# Patient Record
Sex: Female | Born: 1975
Health system: Southern US, Community
[De-identification: ages and names within clinical notes are randomized; demographics above are authoritative.]

## PROBLEM LIST (undated history)

## (undated) DIAGNOSIS — K760 Fatty (change of) liver, not elsewhere classified: Secondary | ICD-10-CM

## (undated) DIAGNOSIS — F329 Major depressive disorder, single episode, unspecified: Secondary | ICD-10-CM

## (undated) DIAGNOSIS — F419 Anxiety disorder, unspecified: Secondary | ICD-10-CM

## (undated) DIAGNOSIS — F32A Depression, unspecified: Secondary | ICD-10-CM

## (undated) DIAGNOSIS — K8020 Calculus of gallbladder without cholecystitis without obstruction: Secondary | ICD-10-CM

## (undated) DIAGNOSIS — K859 Acute pancreatitis without necrosis or infection, unspecified: Secondary | ICD-10-CM

## (undated) DIAGNOSIS — D179 Benign lipomatous neoplasm, unspecified: Secondary | ICD-10-CM

## (undated) DIAGNOSIS — B019 Varicella without complication: Secondary | ICD-10-CM

## (undated) DIAGNOSIS — Z87442 Personal history of urinary calculi: Secondary | ICD-10-CM

## (undated) DIAGNOSIS — M1990 Unspecified osteoarthritis, unspecified site: Secondary | ICD-10-CM

## (undated) DIAGNOSIS — E785 Hyperlipidemia, unspecified: Secondary | ICD-10-CM

## (undated) DIAGNOSIS — K219 Gastro-esophageal reflux disease without esophagitis: Secondary | ICD-10-CM

## (undated) HISTORY — DX: Major depressive disorder, single episode, unspecified: F32.9

## (undated) HISTORY — DX: Varicella without complication: B01.9

## (undated) HISTORY — DX: Depression, unspecified: F32.A

## (undated) HISTORY — PX: UPPER GI ENDOSCOPY: SHX6162

## (undated) HISTORY — DX: Anxiety disorder, unspecified: F41.9

## (undated) HISTORY — PX: CHOLECYSTECTOMY: SHX55

## (undated) HISTORY — PX: SHOULDER SURGERY: SHX246

## (undated) HISTORY — DX: Unspecified osteoarthritis, unspecified site: M19.90

## (undated) HISTORY — DX: Acute pancreatitis without necrosis or infection, unspecified: K85.90

## (undated) HISTORY — DX: Gastro-esophageal reflux disease without esophagitis: K21.9

## (undated) HISTORY — DX: Hyperlipidemia, unspecified: E78.5

## (undated) HISTORY — DX: Calculus of gallbladder without cholecystitis without obstruction: K80.20

---

## 2008-02-16 ENCOUNTER — Other Ambulatory Visit: Admission: RE | Admit: 2008-02-16 | Discharge: 2008-02-16 | Payer: Self-pay | Admitting: Gynecology

## 2008-03-23 ENCOUNTER — Emergency Department (HOSPITAL_BASED_OUTPATIENT_CLINIC_OR_DEPARTMENT_OTHER): Admission: EM | Admit: 2008-03-23 | Discharge: 2008-03-23 | Payer: Self-pay | Admitting: Emergency Medicine

## 2008-07-22 ENCOUNTER — Other Ambulatory Visit: Admission: RE | Admit: 2008-07-22 | Discharge: 2008-07-22 | Payer: Self-pay | Admitting: Gynecology

## 2011-04-02 LAB — COMPREHENSIVE METABOLIC PANEL
BUN: 7
Calcium: 9.2
Glucose, Bld: 103 — ABNORMAL HIGH
Sodium: 140
Total Protein: 7.7

## 2011-04-02 LAB — PREGNANCY, URINE: Preg Test, Ur: NEGATIVE

## 2011-04-02 LAB — CBC
HCT: 42.4
Hemoglobin: 14.5
MCHC: 34.2
RDW: 12.5

## 2011-04-02 LAB — LIPASE, BLOOD: Lipase: 90

## 2013-04-28 ENCOUNTER — Ambulatory Visit (INDEPENDENT_AMBULATORY_CARE_PROVIDER_SITE_OTHER): Payer: BC Managed Care – PPO | Admitting: Physician Assistant

## 2013-04-28 VITALS — BP 102/78 | HR 80 | Temp 98.3°F | Resp 18 | Ht 65.75 in | Wt 147.4 lb

## 2013-04-28 DIAGNOSIS — R2231 Localized swelling, mass and lump, right upper limb: Secondary | ICD-10-CM

## 2013-04-28 DIAGNOSIS — L738 Other specified follicular disorders: Secondary | ICD-10-CM

## 2013-04-28 DIAGNOSIS — L739 Follicular disorder, unspecified: Secondary | ICD-10-CM

## 2013-04-28 DIAGNOSIS — R229 Localized swelling, mass and lump, unspecified: Secondary | ICD-10-CM

## 2013-04-28 DIAGNOSIS — Z1231 Encounter for screening mammogram for malignant neoplasm of breast: Secondary | ICD-10-CM

## 2013-04-28 LAB — POCT CBC
HCT, POC: 44.7 % (ref 37.7–47.9)
Lymph, poc: 2 (ref 0.6–3.4)
MCHC: 31.1 g/dL — AB (ref 31.8–35.4)
MCV: 100.3 fL — AB (ref 80–97)
POC LYMPH PERCENT: 21.7 %L (ref 10–50)
RDW, POC: 13.8 %
WBC: 9.3 10*3/uL (ref 4.6–10.2)

## 2013-04-28 MED ORDER — CEPHALEXIN 500 MG PO CAPS: 500.0000 mg | ORAL_CAPSULE | Freq: Three times a day (TID) | ORAL | Status: DC

## 2013-04-28 NOTE — Progress Notes (Signed)
[address] [place] [address]   Phone [phone]  Subjective:    Patient ID: Angela Hess, female    DOB: June 22, 1976, 37 y.o.   MRN: [id number]  HPI Pt presents to clinic with area under her left axilla that is tender that she has had for about 4-6 weeks.  She had a few weeks but then it became tender and she was put on Doxy but felt like it did not help much - while she was on doxy she developed a bump under her L axilla and then another one under her L side.  She is concerned because she just finished the abx and now the area under her R axilla is tender.  She does not do SBE and has never had a mammogram.  She has breast-feed 4 children.  She has no family history of breast cancer.  She has done nothing to these areas.  She has continued to shave and use deodorant until today.  Her counselor suggested she be seen for the area.  She feels fine.   Review of Systems  Skin: Positive for wound.       Objective:   Physical Exam  Vitals reviewed. Constitutional: She is oriented to person, place, and time. She appears well-developed and well-nourished.  HENT:  Head: Normocephalic and atraumatic.  Right Ear: External ear normal.  Left Ear: External ear normal.  Neck: Normal range of motion.  Pulmonary/Chest: Effort normal.  Genitourinary: No breast swelling, tenderness, discharge or bleeding.  Lymphadenopathy:       Head (right side): No submental, no submandibular, no tonsillar, no preauricular, no posterior auricular and no occipital adenopathy present.       Head (left side): No submental, no submandibular, no tonsillar, no preauricular, no posterior auricular and no occipital adenopathy present.    She has no cervical adenopathy.    She has no axillary adenopathy.       Right: No supraclavicular adenopathy present.       Left: No supraclavicular adenopathy present.  Neurological: She is alert and oriented to person, place, and time.  Skin: Skin is warm and dry.  Small 1cm  erythematous tender area in right axilla with a central hair follicle but no pustule.  No palpable lesions in the left axilla.  Psychiatric: She has a normal mood and affect. Her behavior is normal. Judgment and thought content normal.   Results for orders placed in visit on 04/28/13  POCT CBC      Result Value Range   WBC 9.3  4.6 - 10.2 K/uL   Lymph, poc 2.0  0.6 - 3.4   POC LYMPH PERCENT 21.7  10 - 50 %L   MID (cbc) 0.5  0 - 0.9   POC MID % 5.8  0 - 12 %M   POC Granulocyte 6.7  2 - 6.9   Granulocyte percent 72.5  37 - 80 %G   RBC 4.46  4.04 - 5.48 M/uL   Hemoglobin 13.9  12.2 - 16.2 g/dL   HCT, POC 46.9  62.9 - 47.9 %   MCV 100.3 (*) 80 - 97 fL   MCH, POC 31.2  27 - 31.2 pg   MCHC 31.1 (*) 31.8 - 35.4 g/dL   RDW, POC 52.8     Platelet Count, POC 284  142 - 424 K/uL   MPV 9.3  0 - 99.8 fL       Assessment & Plan:  Folliculitis - Plan: cephALEXin (KEFLEX) 500  MG capsule, POCT CBC  Visit for screening mammogram - Plan: MM Digital Screening  Axillary mass, right - most likely this is a reaction from shaving and a local reaction or folliculitis that I am unsure why it did not respond to Doxy.  We will start keflex.  She will stop shaving and using deodorant while this heals and use warm compresses.  We will check a mammogram for screening purposes due to the fact that the bump is red and resolves in several days and the fact that is does not feel like a lymph node and moves sides.  Benny Lennert PA-C 04/28/2013 12:16 PM

## 2013-06-06 ENCOUNTER — Ambulatory Visit: Payer: BC Managed Care – PPO

## 2013-06-06 ENCOUNTER — Ambulatory Visit: Payer: Self-pay

## 2013-06-06 ENCOUNTER — Ambulatory Visit (INDEPENDENT_AMBULATORY_CARE_PROVIDER_SITE_OTHER): Payer: BC Managed Care – PPO | Admitting: Family Medicine

## 2013-06-06 VITALS — BP 124/80 | HR 94 | Temp 98.2°F | Resp 16 | Ht 65.5 in | Wt 148.0 lb

## 2013-06-06 DIAGNOSIS — R519 Headache, unspecified: Secondary | ICD-10-CM

## 2013-06-06 DIAGNOSIS — M25512 Pain in left shoulder: Secondary | ICD-10-CM

## 2013-06-06 DIAGNOSIS — M25539 Pain in unspecified wrist: Secondary | ICD-10-CM

## 2013-06-06 DIAGNOSIS — M542 Cervicalgia: Secondary | ICD-10-CM

## 2013-06-06 DIAGNOSIS — M79641 Pain in right hand: Secondary | ICD-10-CM

## 2013-06-06 DIAGNOSIS — M79609 Pain in unspecified limb: Secondary | ICD-10-CM

## 2013-06-06 DIAGNOSIS — R51 Headache: Secondary | ICD-10-CM

## 2013-06-06 DIAGNOSIS — M25531 Pain in right wrist: Secondary | ICD-10-CM

## 2013-06-06 DIAGNOSIS — M25519 Pain in unspecified shoulder: Secondary | ICD-10-CM

## 2013-06-06 MED ORDER — CYCLOBENZAPRINE HCL 10 MG PO TABS: 10.0000 mg | ORAL_TABLET | Freq: Three times a day (TID) | ORAL | Status: DC | PRN

## 2013-06-06 MED ORDER — HYDROCODONE-ACETAMINOPHEN 5-325 MG PO TABS: 1.0000 | ORAL_TABLET | Freq: Four times a day (QID) | ORAL | Status: DC | PRN

## 2013-06-06 MED ORDER — NAPROXEN 500 MG PO TABS: 500.0000 mg | ORAL_TABLET | Freq: Two times a day (BID) | ORAL | Status: DC
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
## 2013-07-07 ENCOUNTER — Telehealth: Payer: Self-pay

## 2013-07-07 NOTE — Telephone Encounter (Signed)
Request given to xray

## 2013-07-07 NOTE — Telephone Encounter (Signed)
PT WOULD LIKE TO COME BY AND PICK UP A COPY OF HER XRAYS, SHE IS HOPING IT WILL SHOW THE BRUISING. NEED ASAP. PLEASE CALL [phone]
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
## 2015-02-02 ENCOUNTER — Encounter (HOSPITAL_BASED_OUTPATIENT_CLINIC_OR_DEPARTMENT_OTHER): Payer: Self-pay | Admitting: Emergency Medicine

## 2015-02-02 ENCOUNTER — Emergency Department (HOSPITAL_BASED_OUTPATIENT_CLINIC_OR_DEPARTMENT_OTHER)
Admission: EM | Admit: 2015-02-02 | Discharge: 2015-02-02 | Disposition: A | Discharge status: Home / Self Care | Payer: 59 | Attending: Emergency Medicine | Admitting: Emergency Medicine

## 2015-02-02 ENCOUNTER — Emergency Department (HOSPITAL_BASED_OUTPATIENT_CLINIC_OR_DEPARTMENT_OTHER): Payer: 59

## 2015-02-02 DIAGNOSIS — R1011 Right upper quadrant pain: Secondary | ICD-10-CM | POA: Diagnosis not present

## 2015-02-02 DIAGNOSIS — R319 Hematuria, unspecified: Secondary | ICD-10-CM | POA: Insufficient documentation

## 2015-02-02 DIAGNOSIS — M1990 Unspecified osteoarthritis, unspecified site: Secondary | ICD-10-CM | POA: Insufficient documentation

## 2015-02-02 DIAGNOSIS — R1013 Epigastric pain: Secondary | ICD-10-CM | POA: Diagnosis present

## 2015-02-02 DIAGNOSIS — R110 Nausea: Secondary | ICD-10-CM | POA: Insufficient documentation

## 2015-02-02 DIAGNOSIS — Z791 Long term (current) use of non-steroidal anti-inflammatories (NSAID): Secondary | ICD-10-CM | POA: Diagnosis not present

## 2015-02-02 DIAGNOSIS — Z8659 Personal history of other mental and behavioral disorders: Secondary | ICD-10-CM | POA: Insufficient documentation

## 2015-02-02 DIAGNOSIS — Z9049 Acquired absence of other specified parts of digestive tract: Secondary | ICD-10-CM | POA: Insufficient documentation

## 2015-02-02 DIAGNOSIS — R945 Abnormal results of liver function studies: Secondary | ICD-10-CM | POA: Insufficient documentation

## 2015-02-02 DIAGNOSIS — Z3202 Encounter for pregnancy test, result negative: Secondary | ICD-10-CM | POA: Insufficient documentation

## 2015-02-02 DIAGNOSIS — R7989 Other specified abnormal findings of blood chemistry: Secondary | ICD-10-CM

## 2015-02-02 LAB — URINE MICROSCOPIC-ADD ON

## 2015-02-02 LAB — COMPREHENSIVE METABOLIC PANEL
ALBUMIN: 4.2 g/dL (ref 3.5–5.0)
ALT: 107 U/L — AB (ref 14–54)
AST: 269 U/L — AB (ref 15–41)
Alkaline Phosphatase: 98 U/L (ref 38–126)
Anion gap: 9 (ref 5–15)
BILIRUBIN TOTAL: 0.8 mg/dL (ref 0.3–1.2)
BUN: 10 mg/dL (ref 6–20)
CO2: 29 mmol/L (ref 22–32)
Calcium: 9.2 mg/dL (ref 8.9–10.3)
Chloride: 101 mmol/L (ref 101–111)
Creatinine, Ser: 0.73 mg/dL (ref 0.44–1.00)
Glucose, Bld: 108 mg/dL — ABNORMAL HIGH (ref 65–99)
POTASSIUM: 4.3 mmol/L (ref 3.5–5.1)
SODIUM: 139 mmol/L (ref 135–145)
Total Protein: 7.2 g/dL (ref 6.5–8.1)

## 2015-02-02 LAB — URINALYSIS, ROUTINE W REFLEX MICROSCOPIC
Bilirubin Urine: NEGATIVE
Glucose, UA: NEGATIVE mg/dL
Ketones, ur: NEGATIVE mg/dL
Nitrite: NEGATIVE
Protein, ur: NEGATIVE mg/dL
Specific Gravity, Urine: 1.013 (ref 1.005–1.030)
Urobilinogen, UA: 0.2 mg/dL (ref 0.0–1.0)
pH: 7.5 (ref 5.0–8.0)

## 2015-02-02 LAB — CBC
HCT: 43.2 % (ref 36.0–46.0)
HEMOGLOBIN: 14.5 g/dL (ref 12.0–15.0)
MCH: 31.5 pg (ref 26.0–34.0)
MCHC: 33.6 g/dL (ref 30.0–36.0)
MCV: 93.9 fL (ref 78.0–100.0)
Platelets: 270 10*3/uL (ref 150–400)
RBC: 4.6 MIL/uL (ref 3.87–5.11)
RDW: 12.7 % (ref 11.5–15.5)
WBC: 9.9 10*3/uL (ref 4.0–10.5)

## 2015-02-02 LAB — PREGNANCY, URINE: Preg Test, Ur: NEGATIVE

## 2015-02-02 LAB — LIPASE, BLOOD: Lipase: 29 U/L (ref 22–51)

## 2015-02-02 MED ORDER — GI COCKTAIL ~~LOC~~
30.0000 mL | Freq: Once | ORAL | Status: AC
Start: 2015-02-02 — End: 2015-02-02
  Administered 2015-02-02: 30 mL via ORAL
  Filled 2015-02-02: qty 30

## 2015-02-02 NOTE — ED Notes (Signed)
Pt returned from US. NAD.

## 2015-02-02 NOTE — Discharge Instructions (Signed)
Follow up with one of the resources attached to establish care with a primary care doctor.  Abdominal Pain Many things can cause abdominal pain. Usually, abdominal pain is not caused by a disease and will improve without treatment. It can often be observed and treated at home. Your health care provider will do a physical exam and possibly order blood tests and X-rays to help determine the seriousness of your pain. However, in many cases, more time must pass before a clear cause of the pain can be found. Before that point, your health care provider may not know if you need more testing or further treatment. HOME CARE INSTRUCTIONS  Monitor your abdominal pain for any changes. The following actions may help to alleviate any discomfort you are experiencing:  Only take over-the-counter or prescription medicines as directed by your health care provider.  Do not take laxatives unless directed to do so by your health care provider.  Try a clear liquid diet (broth, tea, or water) as directed by your health care provider. Slowly move to a bland diet as tolerated. SEEK MEDICAL CARE IF:  You have unexplained abdominal pain.  You have abdominal pain associated with nausea or diarrhea.  You have pain when you urinate or have a bowel movement.  You experience abdominal pain that wakes you in the night.  You have abdominal pain that is worsened or improved by eating food.  You have abdominal pain that is worsened with eating fatty foods.  You have a fever. SEEK IMMEDIATE MEDICAL CARE IF:   Your pain does not go away within 2 hours.  You keep throwing up (vomiting).  Your pain is felt only in portions of the abdomen, such as the right side or the left lower portion of the abdomen.  You pass bloody or black tarry stools. MAKE SURE YOU:  Understand these instructions.   Will watch your condition.   Will get help right away if you are not doing well or get worse.  Document Released:  03/28/2005 Document Revised: 06/23/2013 Document Reviewed: 02/25/2013 [facility] Patient Information 2015 [place], [place]. This information is not intended to replace advice given to you by your health care provider. Make sure you discuss any questions you have with your health care provider. Liver Profile A liver profile is a battery of tests which helps your caregiver evaluate your liver function. The following tests are often included in the liver profile: Alanine aminotransferase (ALT or SGPT) This is an enzyme found primarily in the liver. Abnormalities may represent liver disease. This is found in cells of the liver so when it is elevated, it has been released by damaged cells. Albumin - The serum albumin is one of the major proteins in the blood and a reflection of the general state of nutrition. This is low when the liver is unable to do its job. It is also low when protein is lost in the urine. NORMAL FINDINGS Adult/Elderly  Total protein: 6.4-8.3 g/dL or 64-83 g/L (SI units)  Albumin: 3.5-5 g/dL or 35-50 g/L (SI units)  Globulin: 2.3-3.4 g/dL  Alpha1 globulin: 0.1-0.3 g/dL or 1-3 g/L (SI units)  Alpha2 globulin: 0.6-1 g/dL or 6-10 g/L (SI units)  Beta globulin: 0.7-1.1 g/dL or 7-11 g/L (SI units) Children  Total protein  Premature infant: 4.2-7.6 g/dL  Newborn: 4.6-7.4 g/dL  Infant: 6-6.7 g/dL  Child: 6.2-8 g/dL  Albumin  Premature infant: 3-4.2 g/dL  Newborn: 3.5-5.4 g/dL  Infant: 4.4-5.4 g/dL  Child: 4-5.9 g/dL Albumin/Globulin ratio - Calculated  by dividing the albumin by the globulin. It is a measure of well being.  Alkaline phosphatase - This is an enzyme which is important in diagnosing proper bone and liver functions. NORMAL FINDINGS Age / Normal Value (units/L)  0-5 days / 35-140  Less than 3 yr / 15-60  3-6 yr / 15-50  6-12 yr / 10-50  12-18 yr / 10-40  Adult / 0-35 units/L or 0-0.58 microKat/L (SI Units) (Females tend to have slightly lower  levels than males)  Elderly / Slightly higher than adults Aspartate aminotransferase (AST or SGOT) - an enzyme found in skeletal and heart muscle, liver and other organs. Abnormalities may represent liver disease. This is found in cells of the liver so when it is elevated, it has been released by damaged cells. Bilirubin, Total: A chemical involved with liver functions. High concentrations may result in jaundice. Jaundice is a yellowing of the skin and the whites of the eyes. NORMAL FINDINGS Blood  Adult/elderly/child  Total bilirubin: 0.3-1.0 mg/dL or 5.1-17 micromole/L (SI units)  Indirect bilirubin: 0.2-0.8 mg/dL or 3.4-12.0 micromole/L (SI units)  Direct bilirubin: 0.1-0.3 mg/dL or 1.7-5.1 micromole/L (SI units)  Newborn total bilirubin: 1.0-12.0 mg/dL or 17.1-205 micromole/L (SI units)  Urine0-0.02 mg/dL Ranges for normal findings may vary among different laboratories and hospitals. You should always check with your doctor after having lab work or other tests done to discuss the meaning of your test results and whether your values are considered within normal limits PREPARATION FOR TEST No preparation or fasting is necessary unless you have been informed otherwise. A blood sample is obtained by inserting a needle into a vein in the arm. MEANING OF TEST  Your caregiver will go over the test results with you and discuss the importance and meaning of your results, as well as treatment options and the need for additional tests if necessary. OBTAINING THE TEST RESULTS It is your responsibility to obtain your test results. Ask the lab or department performing the test when and how you will get your results. Document Released: 07/21/2004 Document Revised: 09/10/2011 Document Reviewed: 10/20/2013 [facility] Patient Information 2015 [place], [place]. This information is not intended to replace advice given to you by your health care provider. Make sure you discuss any questions you have with  your health care provider.

## 2015-02-02 NOTE — ED Provider Notes (Signed)
CSN: [id number]     Arrival date & time 02/02/15  1546 History   First MD Initiated Contact with Patient 02/02/15 1556     Chief Complaint  Patient presents with  . Abdominal Pain     (Consider location/radiation/quality/duration/timing/severity/associated sxs/prior Treatment) HPI Comments: 39 year old female presenting with sudden onset epigastric abdominal pain beginning earlier this morning that radiated towards her right upper quadrant and down just above her umbilicus, lasted about 10 minutes, described as tight, squeezing and intense and subsided on its own. The pain has been waxing and waning throughout the day, just not as intense at initial onset. Occasional radiation towards her back. Admits to associated nausea with onset of pain without vomiting. She felt very sweaty. This afternoon, after urinating, she noticed a small amount of pink on the toilet tissue and believes this was in her urine. Denies vaginal bleeding or discharge. Has a Mirena which has been in place for 4 years. History of cholecystectomy, states this feels almost the same as when she had gallstones. Denies eating anything out of her normal and had a "plain Sandwich" today. Denies fever or chills. No diarrhea or constipation. States her stools appear "caramel-colored".  Patient is a 39 y.o. female presenting with abdominal pain. The history is provided by the patient.  Abdominal Pain Associated symptoms: hematuria and nausea     Past Medical History  Diagnosis Date  . Arthritis   . Anxiety    Past Surgical History  Procedure Laterality Date  . Cholecystectomy    . Shoulder surgery     Family History  Problem Relation Age of Onset  . Kidney disease Mother   . Diabetes Maternal Grandfather   . Heart disease Paternal Grandmother   . Stroke Paternal Grandfather    History  Substance Use Topics  . Smoking status: Never Smoker   . Smokeless tobacco: Not on file  . Alcohol Use: No   OB History    No data  available     Review of Systems  Gastrointestinal: Positive for nausea and abdominal pain.  Genitourinary: Positive for hematuria.  All other systems reviewed and are negative.     Allergies  Review of patient's allergies indicates no known allergies.  Home Medications   Prior to Admission medications   Medication Sig Start Date End Date Taking? Authorizing Provider  cyclobenzaprine (FLEXERIL) 10 MG tablet Take 1 tablet (10 mg total) by mouth 3 (three) times daily as needed for muscle spasms. 06/06/13   Shawnee Knapp, MD  HYDROcodone-acetaminophen (NORCO/VICODIN) 5-325 MG per tablet Take 1 tablet by mouth every 6 (six) hours as needed for moderate pain. 06/06/13   Shawnee Knapp, MD  naproxen (NAPROSYN) 500 MG tablet Take 1 tablet (500 mg total) by mouth 2 (two) times daily with a meal. 06/06/13   Shawnee Knapp, MD   BP 116/89 mmHg  Pulse 68  Temp(Src) 98.6 F (37 C) (Oral)  Resp 16  Ht 5\' 6"  (1.676 m)  Wt 150 lb (68.04 kg)  BMI 24.22 kg/m2  SpO2 100% Physical Exam  Constitutional: She is oriented to person, place, and time. She appears well-developed and well-nourished. No distress.  HENT:  Head: Normocephalic and atraumatic.  Mouth/Throat: Oropharynx is clear and moist.  Eyes: Conjunctivae and EOM are normal.  Neck: Normal range of motion. Neck supple.  Cardiovascular: Normal rate, regular rhythm and normal heart sounds.   Pulmonary/Chest: Effort normal and breath sounds normal. No respiratory distress.  Abdominal: Soft. Normal appearance and  bowel sounds are normal. She exhibits no distension. There is tenderness (mild) in the right upper quadrant and epigastric area. There is no rigidity, no rebound, no guarding, no CVA tenderness, no tenderness at McBurney's point and negative Murphy's sign.  No peritoneal signs.  Musculoskeletal: Normal range of motion. She exhibits no edema.  Neurological: She is alert and oriented to person, place, and time. No sensory deficit.  Skin: Skin is  warm and dry.  Psychiatric: She has a normal mood and affect. Her behavior is normal.  Nursing note and vitals reviewed.   ED Course  Procedures (including critical care time) Labs Review Labs Reviewed  URINALYSIS, ROUTINE W REFLEX MICROSCOPIC (NOT AT [facility]) - Abnormal; Notable for the following:    APPearance CLOUDY (*)    Hgb urine dipstick LARGE (*)    Leukocytes, UA SMALL (*)    All other components within normal limits  COMPREHENSIVE METABOLIC PANEL - Abnormal; Notable for the following:    Glucose, Bld 108 (*)    AST 269 (*)    ALT 107 (*)    All other components within normal limits  URINE MICROSCOPIC-ADD ON - Abnormal; Notable for the following:    Bacteria, UA FEW (*)    All other components within normal limits  PREGNANCY, URINE  CBC  LIPASE, BLOOD    Imaging Review US Abdomen Complete  02/02/2015   CLINICAL DATA:  Recently worsening chronic right upper quadrant abdominal pain. Nausea. Initial encounter.  EXAM: ULTRASOUND ABDOMEN COMPLETE  COMPARISON:  None.  FINDINGS: Gallbladder: Status post cholecystectomy.  No retained stones seen.  Common bile duct: Diameter: 0.4 cm, within normal limits in caliber.  Liver: No focal lesion identified. Mildly increased parenchymal echogenicity likely reflects fatty infiltration.  IVC: No abnormality visualized.  Pancreas: Visualized portion unremarkable.  Spleen: Size and appearance within normal limits.  Right Kidney: Length: 10.1 cm. Echogenicity within normal limits. No mass or hydronephrosis visualized.  Left Kidney: Length: 10.6 cm. Echogenicity within normal limits. No mass or hydronephrosis visualized.  Abdominal aorta: No aneurysm visualized.  Other findings: Evaluation is suboptimal due to overlying bowel gas.  IMPRESSION: 1. No acute abnormality seen within the abdomen. Status post cholecystectomy. 2. Mild fatty infiltration within the liver.   Electronically Signed   By: Garald Balding M.D.   On: 02/02/2015 19:00     EKG  Interpretation None      MDM   Final diagnoses:  Epigastric abdominal pain  Elevated LFTs   Non-toxic appearing, NAD. Abdomen is soft with mild tenderness. No peritoneal signs. Blood noted in urine without infection. Given tenderness epigastrium and RUQ, labs obtained to check LFTs/lipase despite hx of cholecystectomy. LFTs elevated, no increase since 2012. [place] without acute finding. Pt's pain began to improve with GI cocktail. Intense pain did not return in ED. It is possible she passed a ureteral stone with the intense pain, waxing and waning, followed by hematuria. Discussed findings with pt, and advised her to establish care with a PCP. I do not feel CT is necessary at this time as her intense pain has not returned and her repeat exam is benign. Doubt appendicitis, TOA, ovarian torsion, diverticulitis, obstruction. Stable for d/c. Resources given for f/u. Return precautions given. Patient states understanding of treatment care plan and is agreeable.  Carman Ching, PA-C 02/02/15 2003  Virgel Manifold, MD 02/02/15 2038

## 2015-02-02 NOTE — ED Notes (Signed)
PA at bedside.

## 2015-02-02 NOTE — ED Notes (Signed)
Patient transported to Ultrasound 

## 2015-02-02 NOTE — ED Notes (Signed)
Pt history of gallbaldder pain, feels the same as last episode. Pain radiates throughout abdomen. Pt states "feels like a gallstone again."

## 2015-02-15 ENCOUNTER — Ambulatory Visit (INDEPENDENT_AMBULATORY_CARE_PROVIDER_SITE_OTHER): Payer: 59 | Admitting: Family

## 2015-02-15 ENCOUNTER — Other Ambulatory Visit (INDEPENDENT_AMBULATORY_CARE_PROVIDER_SITE_OTHER): Payer: 59

## 2015-02-15 ENCOUNTER — Telehealth: Payer: Self-pay | Admitting: Family

## 2015-02-15 ENCOUNTER — Encounter: Payer: Self-pay | Admitting: Family

## 2015-02-15 VITALS — BP 100/72 | HR 97 | Temp 98.1°F | Resp 18 | Ht 65.5 in | Wt 154.1 lb

## 2015-02-15 DIAGNOSIS — R945 Abnormal results of liver function studies: Principal | ICD-10-CM

## 2015-02-15 DIAGNOSIS — R7989 Other specified abnormal findings of blood chemistry: Secondary | ICD-10-CM

## 2015-02-15 DIAGNOSIS — R635 Abnormal weight gain: Secondary | ICD-10-CM

## 2015-02-15 DIAGNOSIS — M26609 Unspecified temporomandibular joint disorder, unspecified side: Secondary | ICD-10-CM | POA: Insufficient documentation

## 2015-02-15 DIAGNOSIS — M2660 Temporomandibular joint disorder, unspecified: Secondary | ICD-10-CM

## 2015-02-15 LAB — HEPATIC FUNCTION PANEL
ALT: 20 U/L (ref 0–35)
AST: 21 U/L (ref 0–37)
Albumin: 4.4 g/dL (ref 3.5–5.2)
Alkaline Phosphatase: 77 U/L (ref 39–117)
BILIRUBIN TOTAL: 0.9 mg/dL (ref 0.2–1.2)
Bilirubin, Direct: 0.1 mg/dL (ref 0.0–0.3)
Total Protein: 7.2 g/dL (ref 6.0–8.3)

## 2015-02-15 LAB — BASIC METABOLIC PANEL
BUN: 7 mg/dL (ref 6–23)
CO2: 30 meq/L (ref 19–32)
Calcium: 9.2 mg/dL (ref 8.4–10.5)
Chloride: 101 mEq/L (ref 96–112)
Creatinine, Ser: 0.76 mg/dL (ref 0.40–1.20)
GFR: 89.39 mL/min (ref >60.00)
Glucose, Bld: 100 mg/dL — ABNORMAL HIGH (ref 70–99)
POTASSIUM: 4.1 meq/L (ref 3.5–5.1)
SODIUM: 137 meq/L (ref 135–145)

## 2015-02-15 LAB — TSH: TSH: 3.18 u[IU]/mL (ref 0.35–4.50)

## 2015-02-15 NOTE — Assessment & Plan Note (Signed)
Elevated liver enzymes in the ED with no apparent causes noted on ultrasound but did have mild infiltration of fatty liver. Obtain hepatic panel to recheck LFTs. Exam benign today. Pending results may refer to GI given continued symptoms.

## 2015-02-15 NOTE — Patient Instructions (Signed)
Thank you for choosing [facility].  Summary/Instructions:  Please follow up with dentistry for your TMJ  Please stop by the lab on the basement level of the building for your blood work. Your results will be released to [place] (or called to you) after review, usually within 72 hours after test completion. If any changes need to be made, you will be notified at that same time.  If your symptoms worsen or fail to improve, please contact our office for further instruction, or in case of emergency go directly to the emergency room at the closest medical facility.   Temporomandibular Problems  Temporomandibular joint (TMJ) dysfunction means there are problems with the joint between your jaw and your skull. This is a joint lined by cartilage like other joints in your body but also has a small disc in the joint which keeps the bones from rubbing on each other. These joints are like other joints and can get inflamed (sore) from arthritis and other problems. When this joint gets sore, it can cause headaches and pain in the jaw and the face. CAUSES  Usually the arthritic types of problems are caused by soreness in the joint. Soreness in the joint can also be caused by overuse. This may come from grinding your teeth. It may also come from mis-alignment in the joint. DIAGNOSIS Diagnosis of this condition can often be made by history and exam. Sometimes your caregiver may need X-rays or an MRI scan to determine the exact cause. It may be necessary to see your dentist to determine if your teeth and jaws are lined up correctly. TREATMENT  Most of the time this problem is not serious; however, sometimes it can persist (become chronic). When this happens medications that will cut down on inflammation (soreness) help. Sometimes a shot of cortisone into the joint will be helpful. If your teeth are not aligned it may help for your dentist to make a splint for your mouth that can help this problem. If no  physical problems can be found, the problem may come from tension. If tension is found to be the cause, biofeedback or relaxation techniques may be helpful. HOME CARE INSTRUCTIONS   Later in the day, applications of ice packs may be helpful. Ice can be used in a plastic bag with a towel around it to prevent frostbite to skin. This may be used about every 2 hours for 20 to 30 minutes, as needed while awake, or as directed by your caregiver.  Only take over-the-counter or prescription medicines for pain, discomfort, or fever as directed by your caregiver.  If physical therapy was prescribed, follow your caregiver's directions.  Wear mouth appliances as directed if they were given. Document Released: 03/13/2001 Document Revised: 09/10/2011 Document Reviewed: 06/20/2008 [facility] Patient Information 2015 [place], [place]. This information is not intended to replace advice given to you by your health care provider. Make sure you discuss any questions you have with your health care provider.

## 2015-02-15 NOTE — Progress Notes (Signed)
Pre visit review using our clinic review tool, if applicable. No additional management support is needed unless otherwise documented below in the visit note. 

## 2015-02-15 NOTE — Progress Notes (Signed)
Subjective:    Patient ID: Angela Hess, female    DOB: 1976/03/21, 39 y.o.   MRN: [id number]  Chief Complaint  Patient presents with  . Establish Care    TMJ is bothering her, weight gain, sweating, feels like she has to pee more, thinks she is going through menopause, liver levels were high at the hospital    Angela Hess is a 39 y.o. female with a PMH of anxiety, arthritis, depression, GERD, hyperlipidemia, and urinary tract infections who presents today for an office visit to establish care.  1.) Elevated LFTs - Seen in the ED for epigastric pain and noted to have elevated LFTs. US of the abdomen at the time revealed no acute abnormalitiy and mild fatty infiltration within the liver. Describes the pain as sharp that radiates around her abdomen that comes in waves. Modifying factors include TUMS and Zantec which did not help. Describes it as the same feelings as gall stones. Not currently experiencing any pain. Fluctuates between constipation and diarrhea. Does express lactose intolerance. Has a history of erosive gastritis.   2.) Weight gain / sweating - Associated symptom weight gain and sweating has been going on for about 1 year. She has been seen by GYN and informed that she may be starting to go through pre-menopause. Skin does break out more. Heat intolerance.   3.) TMJ - Associated symptom of cracking of her jaw occurs when she eats. Has had a locking of her jaw and the occasional pain. Does note she grinds her teeth at night.  No Known Allergies   Outpatient Prescriptions Prior to Visit  Medication Sig Dispense Refill  . cyclobenzaprine (FLEXERIL) 10 MG tablet Take 1 tablet (10 mg total) by mouth 3 (three) times daily as needed for muscle spasms. 30 tablet 0  . HYDROcodone-acetaminophen (NORCO/VICODIN) 5-325 MG per tablet Take 1 tablet by mouth every 6 (six) hours as needed for moderate pain. 30 tablet 0  . naproxen (NAPROSYN) 500 MG tablet Take 1 tablet (500 mg  total) by mouth 2 (two) times daily with a meal. 30 tablet 0   No facility-administered medications prior to visit.     Past Medical History  Diagnosis Date  . Arthritis   . Anxiety   . Chicken pox   . Depression   . GERD (gastroesophageal reflux disease)   . Hyperlipidemia   . UTI (lower urinary tract infection)      Past Surgical History  Procedure Laterality Date  . Cholecystectomy    . Shoulder surgery       Family History  Problem Relation Age of Onset  . Kidney disease Mother     Kidney stones  . Arthritis Mother   . Diabetes Maternal Grandfather   . Arthritis Maternal Grandfather   . Heart disease Paternal Grandmother   . Arthritis Paternal Grandmother   . Hyperlipidemia Paternal Grandmother   . Stroke Paternal Grandfather   . Arthritis Paternal Grandfather   . Alcohol abuse Father   . Arthritis Maternal Grandmother      Social History   Social History  . Marital Status: Single    Spouse Name: N/A  . Number of Children: 4  . Years of Education: 16   Occupational History  . [employer]    Social History Main Topics  . Smoking status: Never Smoker   . Smokeless tobacco: Never Used  . Alcohol Use: 0.6 - 1.8 oz/week    1-3 Glasses of wine per week  .  Drug Use: No  . Sexual Activity: Yes    Birth Control/ Protection: IUD   Other Topics Concern  . Not on file   Social History Narrative   Fun: [employer] out with the children   Feels safe at home and denies abuse      Review of Systems  Constitutional: Negative for fever and chills.  HENT:       Positive for jaw clicking  Gastrointestinal: Negative for nausea, vomiting, abdominal pain, diarrhea, constipation and blood in stool.  Endocrine: Positive for heat intolerance.      Objective:    BP 100/72 mmHg  Pulse 97  Temp(Src) 98.1 F (36.7 C) (Oral)  Resp 18  Ht 5' 5.5" (1.664 m)  Wt 154 lb 1.9 oz (69.908 kg)  BMI 25.25 kg/m2  SpO2 98% Nursing note and vital signs  reviewed.  Physical Exam  Constitutional: She is oriented to person, place, and time. She appears well-developed and well-nourished. No distress.  Cardiovascular: Normal rate, regular rhythm, normal heart sounds and intact distal pulses.   Pulmonary/Chest: Effort normal and breath sounds normal.  Abdominal: Soft. Normal appearance and bowel sounds are normal. She exhibits no mass. There is no hepatomegaly. There is no tenderness. There is no rigidity, no rebound, no guarding, no tenderness at McBurney's point and negative Murphy's sign.  Neurological: She is alert and oriented to person, place, and time.  Skin: Skin is warm and dry.  Psychiatric: She has a normal mood and affect. Her behavior is normal. Judgment and thought content normal.       Assessment & Plan:   Problem List Items Addressed This Visit      Musculoskeletal and Integument   TMJ dysfunction    TMJ noted. Treat conservatively with over the counter medications as needed for discomfort and follow up with dentistry for mouth guard or other interventions.         Other   Elevated liver function tests - Primary    Elevated liver enzymes in the ED with no apparent causes noted on ultrasound but did have mild infiltration of fatty liver. Obtain hepatic panel to recheck LFTs. Exam benign today. Pending results may refer to GI given continued symptoms.       Relevant Orders   Hepatic function panel   Basic Metabolic Panel (BMET)   Weight gain    Notes weight gain and may be possibly going through early menopause and cannot rule out thyroid. Obtain TSH. If TSH is negative most likely related to GYN symptoms.       Relevant Orders   TSH

## 2015-02-15 NOTE — Assessment & Plan Note (Signed)
Notes weight gain and may be possibly going through early menopause and cannot rule out thyroid. Obtain TSH. If TSH is negative most likely related to GYN symptoms.

## 2015-02-15 NOTE — Assessment & Plan Note (Signed)
TMJ noted. Treat conservatively with over the counter medications as needed for discomfort and follow up with dentistry for mouth guard or other interventions.

## 2015-02-15 NOTE — Telephone Encounter (Signed)
Please inform patient that her liver enzymes and the rest of her blood work is normal. Therefore I think it is safe to say we can simply watch and wait regarding her liver. Since she continues to experience abdominal pains I will refer her to gastroenterology. Her thyroid function test was borderline increased but within the normal range. Therefore we can retest during her next visit.

## 2015-02-16 ENCOUNTER — Encounter: Payer: Self-pay | Admitting: Family

## 2015-02-17 NOTE — Telephone Encounter (Signed)
Pt aware of results. She was concerned about how her AST and ALT being so high 2 weeks ago and now its back to normal. She just would like to know what would make those levels so high.

## 2015-02-17 NOTE — Telephone Encounter (Signed)
It is most likely a viral infection, unfortunately I cannot say for sure. We will continue to monitor it at this time and recheck in about 3 months.

## 2015-02-18 NOTE — Telephone Encounter (Signed)
Pt emailed

## 2015-04-11 ENCOUNTER — Ambulatory Visit (INDEPENDENT_AMBULATORY_CARE_PROVIDER_SITE_OTHER): Payer: 59 | Admitting: Gastroenterology

## 2015-04-11 ENCOUNTER — Other Ambulatory Visit (INDEPENDENT_AMBULATORY_CARE_PROVIDER_SITE_OTHER): Payer: 59

## 2015-04-11 ENCOUNTER — Encounter: Payer: Self-pay | Admitting: Gastroenterology

## 2015-04-11 VITALS — BP 102/60 | Ht 65.5 in | Wt 158.4 lb

## 2015-04-11 DIAGNOSIS — K219 Gastro-esophageal reflux disease without esophagitis: Secondary | ICD-10-CM | POA: Diagnosis not present

## 2015-04-11 DIAGNOSIS — R109 Unspecified abdominal pain: Secondary | ICD-10-CM

## 2015-04-11 DIAGNOSIS — R7989 Other specified abnormal findings of blood chemistry: Secondary | ICD-10-CM

## 2015-04-11 DIAGNOSIS — R945 Abnormal results of liver function studies: Principal | ICD-10-CM

## 2015-04-11 LAB — IRON AND TIBC
%SAT: 27 % (ref 11–50)
Iron: 85 ug/dL (ref 40–190)
TIBC: 319 ug/dL (ref 250–450)
UIBC: 234 ug/dL (ref 125–400)

## 2015-04-11 LAB — FERRITIN: Ferritin: 61.6 ng/mL (ref 10.0–291.0)

## 2015-04-11 LAB — CK: Total CK: 40 U/L (ref 7–177)

## 2015-04-11 LAB — IGA: IGA: 210 mg/dL (ref 68–378)

## 2015-04-11 NOTE — Progress Notes (Signed)
HPI : 39 y/o female here in consultation for elevated LFTs by Mauricio Po FNP  She reports she was seen in early August with abdominal pain and had elevated LFT with AST > ALT. She presented with abdominal pain, which reminded her of her prior gallstone pain. She had severe pain in the epigastric area in a band distribution. She had diaphoresis with this. She had severe pain lasting for 10 minutes, and then a lingering pain for a 1-2 days or so, and then it resolved on its own. She was given a GI cocktail which did not do anything. She had no no nausea or vomiting. She reports it reminded her of gallstone pain, which led to have have her GB removed in 2011/2012. She reports a history of pancreatitis due to gallstones? Unclear timing of pancreatitis in relation to cholecystectomy but she reports it was during the same admission. She has not had pancreatitis since her gallbladder was removed. She has not had any recurrence of pain since this past August or so. She also had hematuria during this ER visit and was thought perhaps she passed a kidney stone which may have passed to cause her pain. She denies NSAIDs. She denies herbal supplements. No tobacco. She endorses rare alcohol use, one drink per week. She denies recent work outs or muscle pains/myalgias.   She otherwise has some ongoing gas and bloating, often in relation to things she eats, such as dairy. She has occasional loose stools, occasional constipation. She endorses a history of hemorrhoids which can be irritated sporadically.    She has had a prior history of EGD in 2008 and she was found to have gastritis. She takes zantac PRN when her stomach is upset. She does endorse some heartburn at times as well.    Past Medical History  Diagnosis Date  . Arthritis   . Anxiety   . Chicken pox   . Depression   . GERD (gastroesophageal reflux disease)   . Hyperlipidemia   . UTI (lower urinary tract infection)   . Gallstones   . Pancreatitis      history of x 1     Past Surgical History  Procedure Laterality Date  . Cholecystectomy    . Shoulder surgery     Family History  Problem Relation Age of Onset  . Kidney disease Mother     Kidney stones  . Arthritis Mother   . Diabetes Maternal Grandfather   . Arthritis Maternal Grandfather   . Heart disease Paternal Grandmother   . Arthritis Paternal Grandmother   . Hyperlipidemia Paternal Grandmother   . Stroke Paternal Grandfather   . Arthritis Paternal Grandfather   . Alcohol abuse Father   . Arthritis Maternal Grandmother    Social History  Substance Use Topics  . Smoking status: Never Smoker   . Smokeless tobacco: Never Used  . Alcohol Use: 0.6 - 1.8 oz/week    1-3 Glasses of wine per week   Current Outpatient Prescriptions  Medication Sig Dispense Refill  . ALPRAZolam (XANAX) 0.25 MG tablet Take 0.25 mg by mouth at bedtime as needed for anxiety.    Marland Kitchen amphetamine-dextroamphetamine (ADDERALL XR) 30 MG 24 hr capsule Take 30 mg by mouth daily.    Marland Kitchen FLUoxetine (PROZAC) 10 MG capsule Take 10 mg by mouth daily.     No current facility-administered medications for this visit.   No Known Allergies   Review of Systems: All systems reviewed and negative except where noted in HPI.  Lab Results  Component Value Date   ALT 20 02/15/2015   AST 21 02/15/2015   ALKPHOS 77 02/15/2015   BILITOT 0.9 02/15/2015   Lab Results  Component Value Date   WBC 9.9 02/02/2015   HGB 14.5 02/02/2015   HCT 43.2 02/02/2015   MCV 93.9 02/02/2015   PLT 270 02/02/2015    LFTs 02/02/2015 AST 269 ALT 107 AP 98 T bil 0.8  RUQ [place] 8/3 - fatty liver ,otherwise no ductal dilation  Physical Exam: BP 102/60 mmHg  Ht 5' 5.5" (1.664 m)  Wt 158 lb 6.4 oz (71.85 kg)  BMI 25.95 kg/m2 Constitutional: Pleasant,well-developed, female in no acute distress. HEENT: Normocephalic and atraumatic. Conjunctivae are normal. No scleral icterus. Neck supple.  Cardiovascular: Normal rate,  regular rhythm.  Pulmonary/chest: Effort normal and breath sounds normal. No wheezing, rales or rhonchi. Abdominal: Soft, nondistended, nontender. Bowel sounds active throughout. There are no masses palpable. No hepatomegaly. Extremities: no edema Lymphadenopathy: No cervical adenopathy noted. Neurological: Alert and oriented to person place and time. Skin: Skin is warm and dry. No rashes noted. Psychiatric: Normal mood and affect. Behavior is normal.   ASSESSMENT AND PLAN: 39 y/o female seen in consultation for abdominal pain and transaminitis. She reports a history of gallstones causing these symptoms previously, once associated with pancreatitis (per patient report), eventually led to cholecystectomy a few years ago and she has not had an episode since then until August. History as above, short lived pain associated with elevation in AST > ALT. This pattern was noted during an episode previously a few years ago and no normals in between episodes, so unsure of its chronicitiy. However, repeat LFTs when pain free showed normalization of her transaminitis when last checked. Her history is concerning for possibly a retained / intrahepatic stone that passed or sphincter of odi dysfunction post cholecystectomy which could present like this, although [place] did not show any dilation of the ducts or stones. Given the ratio of AST > ALT, I asked her about alcohol use and she is emphatic she doesn't drink much. She otherwise denies myalgias or recent workouts which could led to skeletal muscle breakdown causing AST > ALT elevation. Given she has had multiple lab draws showing a transaminitis over time will obtain baseline lab work to rule out chronic liver diseases, and repeat LFTs to ensure they have remained normal since when last checked. That being said, her symptoms are more concerning for a passed stone or SOD. Will await labs, and consider MRCP / evaluation of the biliary tree pending her course. If she has a  recurrence of pain in the interim I asked her to call me for reassessment.   Otherwise, she has some ongoing reflux symptoms and some nonspecific bloating. Recommended a trial of low dose omeprazole for reflux and to see if it helps her symptoms.  I will also screen her for celiac disease given her bloating, and if symptoms persist we can consider upper endoscopy.   We will let her know results of lab work with further recommendations, and await course on PPI. She agreed.   Brookhaven Cellar, MD [facility] Pager [phone]

## 2015-04-11 NOTE — Patient Instructions (Signed)
Your physician has requested that you go to the basement for  lab work before leaving today.   

## 2015-04-12 LAB — HEPATITIS C ANTIBODY: HCV AB: NEGATIVE

## 2015-04-12 LAB — ANA: ANA: NEGATIVE

## 2015-04-12 LAB — TISSUE TRANSGLUTAMINASE, IGA: TISSUE TRANSGLUTAMINASE AB, IGA: 1 U/mL (ref <4)

## 2015-04-12 LAB — MITOCHONDRIAL ANTIBODIES: Mitochondrial M2 Ab, IgG: 0.2 (ref <0.91)

## 2015-04-12 LAB — ANTI-SMOOTH MUSCLE ANTIBODY, IGG: SMOOTH MUSCLE AB: 6 U (ref <20)

## 2015-04-12 LAB — HEPATITIS B SURFACE ANTIGEN: Hepatitis B Surface Ag: NEGATIVE

## 2015-04-12 LAB — IGG: IGG (IMMUNOGLOBIN G), SERUM: 827 mg/dL (ref 690–1700)

## 2015-04-13 LAB — ALPHA-1-ANTITRYPSIN: A-1 Antitrypsin, Ser: 147 mg/dL (ref 83–199)

## 2015-04-13 LAB — CERULOPLASMIN: CERULOPLASMIN: 20 mg/dL (ref 18–53)

## 2015-04-15 ENCOUNTER — Other Ambulatory Visit: Payer: Self-pay | Admitting: *Deleted

## 2015-04-15 DIAGNOSIS — R7989 Other specified abnormal findings of blood chemistry: Secondary | ICD-10-CM

## 2015-04-15 DIAGNOSIS — R945 Abnormal results of liver function studies: Principal | ICD-10-CM

## 2015-04-22 ENCOUNTER — Other Ambulatory Visit (INDEPENDENT_AMBULATORY_CARE_PROVIDER_SITE_OTHER): Payer: 59

## 2015-04-22 DIAGNOSIS — R945 Abnormal results of liver function studies: Principal | ICD-10-CM

## 2015-04-22 DIAGNOSIS — R7989 Other specified abnormal findings of blood chemistry: Secondary | ICD-10-CM

## 2015-04-22 LAB — HEPATIC FUNCTION PANEL
ALK PHOS: 72 U/L (ref 39–117)
ALT: 10 U/L (ref 0–35)
AST: 15 U/L (ref 0–37)
Albumin: 4.3 g/dL (ref 3.5–5.2)
BILIRUBIN DIRECT: 0.2 mg/dL (ref 0.0–0.3)
BILIRUBIN TOTAL: 0.9 mg/dL (ref 0.2–1.2)
Total Protein: 7.1 g/dL (ref 6.0–8.3)

## 2015-04-25 ENCOUNTER — Other Ambulatory Visit: Payer: Self-pay | Admitting: Gastroenterology

## 2015-04-25 ENCOUNTER — Ambulatory Visit (HOSPITAL_COMMUNITY)
Admission: RE | Admit: 2015-04-25 | Discharge: 2015-04-25 | Disposition: A | Discharge status: Home / Self Care | Payer: 59 | Source: Ambulatory Visit | Attending: Gastroenterology | Admitting: Gastroenterology

## 2015-04-25 DIAGNOSIS — Z9049 Acquired absence of other specified parts of digestive tract: Secondary | ICD-10-CM | POA: Diagnosis not present

## 2015-04-25 DIAGNOSIS — R945 Abnormal results of liver function studies: Principal | ICD-10-CM

## 2015-04-25 DIAGNOSIS — R7989 Other specified abnormal findings of blood chemistry: Secondary | ICD-10-CM

## 2015-04-25 DIAGNOSIS — R109 Unspecified abdominal pain: Secondary | ICD-10-CM | POA: Diagnosis not present

## 2015-04-25 MED ORDER — GADOBENATE DIMEGLUMINE 529 MG/ML IV SOLN
15.0000 mL | Freq: Once | INTRAVENOUS | Status: AC | PRN
  Administered 2015-04-25: 14 mL via INTRAVENOUS

## 2015-08-18 ENCOUNTER — Ambulatory Visit: Payer: 59 | Admitting: Family

## 2015-10-11 DIAGNOSIS — E6609 Other obesity due to excess calories: Secondary | ICD-10-CM | POA: Diagnosis not present

## 2015-10-11 DIAGNOSIS — Z01419 Encounter for gynecological examination (general) (routine) without abnormal findings: Secondary | ICD-10-CM | POA: Diagnosis not present

## 2015-10-11 DIAGNOSIS — Z6825 Body mass index (BMI) 25.0-25.9, adult: Secondary | ICD-10-CM | POA: Diagnosis not present

## 2015-11-02 DIAGNOSIS — Z30433 Encounter for removal and reinsertion of intrauterine contraceptive device: Secondary | ICD-10-CM | POA: Diagnosis not present

## 2015-11-02 DIAGNOSIS — Z30431 Encounter for routine checking of intrauterine contraceptive device: Secondary | ICD-10-CM | POA: Diagnosis not present

## 2015-11-14 DIAGNOSIS — N390 Urinary tract infection, site not specified: Secondary | ICD-10-CM | POA: Diagnosis not present

## 2015-11-14 DIAGNOSIS — N870 Mild cervical dysplasia: Secondary | ICD-10-CM | POA: Diagnosis not present

## 2015-11-14 DIAGNOSIS — R87610 Atypical squamous cells of undetermined significance on cytologic smear of cervix (ASC-US): Secondary | ICD-10-CM | POA: Diagnosis not present

## 2015-12-27 DIAGNOSIS — R87610 Atypical squamous cells of undetermined significance on cytologic smear of cervix (ASC-US): Secondary | ICD-10-CM | POA: Diagnosis not present

## 2016-01-31 DIAGNOSIS — F900 Attention-deficit hyperactivity disorder, predominantly inattentive type: Secondary | ICD-10-CM | POA: Diagnosis not present

## 2016-07-24 DIAGNOSIS — F900 Attention-deficit hyperactivity disorder, predominantly inattentive type: Secondary | ICD-10-CM | POA: Diagnosis not present

## 2016-07-24 DIAGNOSIS — F411 Generalized anxiety disorder: Secondary | ICD-10-CM | POA: Diagnosis not present

## 2017-01-15 DIAGNOSIS — F4322 Adjustment disorder with anxiety: Secondary | ICD-10-CM | POA: Diagnosis not present

## 2017-01-15 DIAGNOSIS — F900 Attention-deficit hyperactivity disorder, predominantly inattentive type: Secondary | ICD-10-CM | POA: Diagnosis not present

## 2017-01-18 ENCOUNTER — Other Ambulatory Visit (INDEPENDENT_AMBULATORY_CARE_PROVIDER_SITE_OTHER): Payer: BLUE CROSS/BLUE SHIELD

## 2017-01-18 ENCOUNTER — Encounter: Payer: Self-pay | Admitting: Family

## 2017-01-18 ENCOUNTER — Ambulatory Visit (INDEPENDENT_AMBULATORY_CARE_PROVIDER_SITE_OTHER): Payer: BLUE CROSS/BLUE SHIELD | Admitting: Family

## 2017-01-18 VITALS — BP 118/86 | HR 94 | Temp 98.3°F | Resp 16 | Ht 66.0 in | Wt 158.0 lb

## 2017-01-18 DIAGNOSIS — R3915 Urgency of urination: Secondary | ICD-10-CM | POA: Insufficient documentation

## 2017-01-18 DIAGNOSIS — R1030 Lower abdominal pain, unspecified: Secondary | ICD-10-CM | POA: Insufficient documentation

## 2017-01-18 LAB — COMPREHENSIVE METABOLIC PANEL
ALBUMIN: 4.5 g/dL (ref 3.5–5.2)
ALK PHOS: 68 U/L (ref 39–117)
ALT: 11 U/L (ref 0–35)
AST: 14 U/L (ref 0–37)
BUN: 9 mg/dL (ref 6–23)
CALCIUM: 9.5 mg/dL (ref 8.4–10.5)
CHLORIDE: 102 meq/L (ref 96–112)
CO2: 29 mEq/L (ref 19–32)
CREATININE: 0.83 mg/dL (ref 0.40–1.20)
GFR: 79.99 mL/min (ref >60.00)
Glucose, Bld: 106 mg/dL — ABNORMAL HIGH (ref 70–99)
POTASSIUM: 4.1 meq/L (ref 3.5–5.1)
SODIUM: 137 meq/L (ref 135–145)
TOTAL PROTEIN: 7 g/dL (ref 6.0–8.3)
Total Bilirubin: 1.4 mg/dL — ABNORMAL HIGH (ref 0.2–1.2)

## 2017-01-18 LAB — CBC
HEMATOCRIT: 43.6 % (ref 36.0–46.0)
HEMOGLOBIN: 14.2 g/dL (ref 12.0–15.0)
MCHC: 32.6 g/dL (ref 30.0–36.0)
MCV: 93.9 fl (ref 78.0–100.0)
Platelets: 276 10*3/uL (ref 150.0–400.0)
RBC: 4.65 Mil/uL (ref 3.87–5.11)
RDW: 13.5 % (ref 11.5–15.5)
WBC: 7.5 10*3/uL (ref 4.0–10.5)

## 2017-01-18 LAB — POCT URINALYSIS DIPSTICK
BILIRUBIN UA: NEGATIVE
GLUCOSE UA: NEGATIVE
KETONES UA: NEGATIVE
Nitrite, UA: NEGATIVE
Protein, UA: NEGATIVE
RBC UA: NEGATIVE
SPEC GRAV UA: 1.01 (ref 1.010–1.025)
Urobilinogen, UA: NEGATIVE E.U./dL — AB
pH, UA: 7 (ref 5.0–8.0)

## 2017-01-18 NOTE — Assessment & Plan Note (Signed)
Lower abdominal pain with questionable origin of gastroenterology versus gynecological source. Unable to reproduce symptoms on exam. Differentials include ovarian cyst, kidney stone, or muscle skeletal origin. In office urinalysis positive for leukocytes and negative for hematuria or nitrites. Recommend follow-up with gynecology for transvaginal ultrasound to check for ovarian cyst. Obtain urine culture to confirm absence of UTI and complete metabolic profile and CBC. Follow-up if symptoms worsen or do not improve pending blood work

## 2017-01-18 NOTE — Patient Instructions (Signed)
Thank you for choosing [facility].  SUMMARY AND INSTRUCTIONS:  Please schedule a follow up with Gynecology.  Schedule a time for your physical at your convenience.  We will check your white/red blood cells and kidney, liver and electrolyte function.  Urine will also be sent for culture.  Labs:  Please stop by the lab on the lower level of the building for your blood work. Your results will be released to [place] (or called to you) after review, usually within 72 hours after test completion. If any changes need to be made, you will be notified at that same time.  1.) The lab is open from 7:30am to 5:30 pm Monday-Friday 2.) No appointment is necessary 3.) Fasting (if needed) is 6-8 hours after food and drink; black coffee and water are okay    Follow up:  If your symptoms worsen or fail to improve, please contact our office for further instruction, or in case of emergency go directly to the emergency room at the closest medical facility.

## 2017-01-18 NOTE — Progress Notes (Signed)
Subjective:    Patient ID: Angela Hess, female    DOB: [birth year], 41 y.o.   MRN: [id number]  Chief Complaint  Patient presents with  . Abdominal Pain    has been having this sharp pain in lower abdomen area has been going on for months on and off, wants liver checked    HPI:  Angela Hess is a 41 y.o. female who  has a past medical history of Anxiety; Arthritis; Chicken pox; Depression; Gallstones; GERD (gastroesophageal reflux disease); Hyperlipidemia; Pancreatitis; and UTI (lower urinary tract infection). and presents today for an office visit.  This is a new problem. Associated symptom of pain located in her left lower abdomen has been waxing and waning for the past 3 months with no specific pattern and described as sharp at times and is not positional or food/meal dependent. Modifying factors include drinking plenty of water which seems to reduce the frequency of the pain. When experience it lasts only for a few seconds at the time and multiple times throughout the day. Denies nausea, vomiting, or blood in stool. Describes alternating constipation and diarrhea with questionable irritable bowel syndrome. Eating and drinking without problem. No fevers or chills. Endorses some urinary urgency which is chronic. Denies dysuria, frequency, flank pain, or hematuria. Has an IUD in place for approximately one year.  No Known Allergies    Outpatient Medications Prior to Visit  Medication Sig Dispense Refill  . amphetamine-dextroamphetamine (ADDERALL XR) 30 MG 24 hr capsule Take 30 mg by mouth daily.    Marland Kitchen ALPRAZolam (XANAX) 0.25 MG tablet Take 0.25 mg by mouth at bedtime as needed for anxiety.    Marland Kitchen FLUoxetine (PROZAC) 10 MG capsule Take 10 mg by mouth daily.     No facility-administered medications prior to visit.       Past Surgical History:  Procedure Laterality Date  . CHOLECYSTECTOMY    . SHOULDER SURGERY        Past Medical History:  Diagnosis Date  . Anxiety   .  Arthritis   . Chicken pox   . Depression   . Gallstones   . GERD (gastroesophageal reflux disease)   . Hyperlipidemia   . Pancreatitis    history of x 1  . UTI (lower urinary tract infection)       Review of Systems  Constitutional: Negative for chills and fever.  Respiratory: Negative for chest tightness and shortness of breath.   Cardiovascular: Negative for chest pain.  Gastrointestinal: Positive for abdominal pain, constipation and diarrhea. Negative for abdominal distention, anal bleeding, blood in stool, nausea, rectal pain and vomiting.  Genitourinary: Positive for urgency. Negative for dysuria, frequency, hematuria and menstrual problem.      Objective:    BP 118/86 (BP Location: Left Arm, Patient Position: Sitting, Cuff Size: Normal)   Pulse 94   Temp 98.3 F (36.8 C) (Oral)   Resp 16   Ht 5\' 6"  (1.676 m)   Wt 158 lb (71.7 kg)   SpO2 98%   BMI 25.50 kg/m  Nursing note and vital signs reviewed.  Physical Exam  Constitutional: She is oriented to person, place, and time. She appears well-developed and well-nourished. No distress.  Cardiovascular: Normal rate, regular rhythm, normal heart sounds and intact distal pulses.   Pulmonary/Chest: Effort normal and breath sounds normal.  Abdominal: Normal appearance and bowel sounds are normal. She exhibits no mass. There is no hepatosplenomegaly. There is no tenderness. There is no rigidity, no rebound, no guarding,  no tenderness at McBurney's point and negative Murphy's sign. No hernia.  Neurological: She is alert and oriented to person, place, and time.  Skin: Skin is warm and dry.  Psychiatric: She has a normal mood and affect. Her behavior is normal. Judgment and thought content normal.       Assessment & Plan:   Problem List Items Addressed This Visit      Other   Lower abdominal pain - Primary    Lower abdominal pain with questionable origin of gastroenterology versus gynecological source. Unable to reproduce  symptoms on exam. Differentials include ovarian cyst, kidney stone, or muscle skeletal origin. In office urinalysis positive for leukocytes and negative for hematuria or nitrites. Recommend follow-up with gynecology for transvaginal ultrasound to check for ovarian cyst. Obtain urine culture to confirm absence of UTI and complete metabolic profile and CBC. Follow-up if symptoms worsen or do not improve pending blood work      Relevant Orders   POCT urinalysis dipstick (Completed)   Urine Culture   Comprehensive metabolic panel   CBC       I have discontinued Ms. Hammonds FLUoxetine and ALPRAZolam. I am also having her maintain her amphetamine-dextroamphetamine.   Follow-up: Return in about 1 month (around 02/18/2017), or if symptoms worsen or fail to improve.  Mauricio Po, FNP

## 2017-01-19 LAB — URINE CULTURE

## 2017-01-20 ENCOUNTER — Encounter: Payer: Self-pay | Admitting: Family

## 2017-01-25 DIAGNOSIS — R102 Pelvic and perineal pain: Secondary | ICD-10-CM | POA: Diagnosis not present

## 2017-03-20 DIAGNOSIS — Z6825 Body mass index (BMI) 25.0-25.9, adult: Secondary | ICD-10-CM | POA: Diagnosis not present

## 2017-03-20 DIAGNOSIS — Z01419 Encounter for gynecological examination (general) (routine) without abnormal findings: Secondary | ICD-10-CM | POA: Diagnosis not present

## 2017-03-20 DIAGNOSIS — N390 Urinary tract infection, site not specified: Secondary | ICD-10-CM | POA: Diagnosis not present

## 2017-03-20 DIAGNOSIS — N760 Acute vaginitis: Secondary | ICD-10-CM | POA: Diagnosis not present

## 2017-05-10 DIAGNOSIS — R110 Nausea: Secondary | ICD-10-CM | POA: Diagnosis not present

## 2017-05-11 DIAGNOSIS — R109 Unspecified abdominal pain: Secondary | ICD-10-CM | POA: Diagnosis not present

## 2017-09-27 DIAGNOSIS — Z0142 Encounter for cervical smear to confirm findings of recent normal smear following initial abnormal smear: Secondary | ICD-10-CM | POA: Diagnosis not present

## 2017-09-27 DIAGNOSIS — R87610 Atypical squamous cells of undetermined significance on cytologic smear of cervix (ASC-US): Secondary | ICD-10-CM | POA: Diagnosis not present

## 2017-10-01 DIAGNOSIS — F900 Attention-deficit hyperactivity disorder, predominantly inattentive type: Secondary | ICD-10-CM | POA: Diagnosis not present

## 2017-10-01 DIAGNOSIS — F410 Panic disorder [episodic paroxysmal anxiety] without agoraphobia: Secondary | ICD-10-CM | POA: Diagnosis not present

## 2017-12-10 DIAGNOSIS — L710 Perioral dermatitis: Secondary | ICD-10-CM | POA: Diagnosis not present

## 2018-03-28 DIAGNOSIS — L718 Other rosacea: Secondary | ICD-10-CM | POA: Diagnosis not present

## 2018-03-28 DIAGNOSIS — L700 Acne vulgaris: Secondary | ICD-10-CM | POA: Diagnosis not present

## 2018-04-09 DIAGNOSIS — R6882 Decreased libido: Secondary | ICD-10-CM | POA: Diagnosis not present

## 2018-04-09 DIAGNOSIS — N951 Menopausal and female climacteric states: Secondary | ICD-10-CM | POA: Diagnosis not present

## 2018-04-09 DIAGNOSIS — R5383 Other fatigue: Secondary | ICD-10-CM | POA: Diagnosis not present

## 2018-04-09 DIAGNOSIS — Z01419 Encounter for gynecological examination (general) (routine) without abnormal findings: Secondary | ICD-10-CM | POA: Diagnosis not present

## 2018-04-09 DIAGNOSIS — R635 Abnormal weight gain: Secondary | ICD-10-CM | POA: Diagnosis not present

## 2018-04-09 DIAGNOSIS — Z6823 Body mass index (BMI) 23.0-23.9, adult: Secondary | ICD-10-CM | POA: Diagnosis not present

## 2018-04-12 DIAGNOSIS — F410 Panic disorder [episodic paroxysmal anxiety] without agoraphobia: Secondary | ICD-10-CM | POA: Diagnosis not present

## 2018-04-12 DIAGNOSIS — F900 Attention-deficit hyperactivity disorder, predominantly inattentive type: Secondary | ICD-10-CM | POA: Diagnosis not present

## 2018-06-20 DIAGNOSIS — Z1231 Encounter for screening mammogram for malignant neoplasm of breast: Secondary | ICD-10-CM | POA: Diagnosis not present

## 2018-07-08 ENCOUNTER — Ambulatory Visit: Payer: Self-pay | Admitting: General Surgery

## 2018-07-08 DIAGNOSIS — D171 Benign lipomatous neoplasm of skin and subcutaneous tissue of trunk: Secondary | ICD-10-CM | POA: Diagnosis not present

## 2018-07-08 DIAGNOSIS — L723 Sebaceous cyst: Secondary | ICD-10-CM | POA: Diagnosis not present

## 2018-07-08 NOTE — H&P (Signed)
History of Present Illness Angela Ruff MD; 12/06/2092 12:29 PM) The patient is a 43 year old female who presents with a complaint of Mass. 43 year old female who presents to the office with complaints of a lower back subcutaneous cutaneous mass that has been present for several years. Over the past 6 months she feels like it has been enlarging and she reports pain especially with sitting for prolonged periods of time. She has never had any of these anywhere else before. She also reports an upper back mass that occasionally swells and becomes inflamed. She was told it was a sebaceous cyst. This is located on her posterior left shoulder.   Past Surgical History Geni Bers [place], RMA; 07/08/2018 11:51 AM) Gallbladder Surgery - Laparoscopic Shoulder Surgery Right.  Diagnostic Studies History Geni Bers [place], RMA; 07/08/2018 11:51 AM) Colonoscopy never  Allergies Geni Bers [place], RMA; 07/08/2018 11:51 AM) No Known Drug Allergies [07/08/2018]: Allergies Reconciled  Medication History [employer], RMA; 07/08/2018 11:52 AM) Adderall XR (30MG  Capsule ER 24HR, Oral) Active. Belviq (10MG  Tablet, Oral) Active. ALPRAZolam ER (1MG  Tablet ER 24HR, Oral) Active. Medications Reconciled  Social History Geni Bers [place], RMA; 07/08/2018 11:51 AM) Alcohol use Occasional alcohol use. Caffeine use Carbonated beverages. No drug use Tobacco use Never smoker.  Family History Geni Bers [place], RMA; 07/08/2018 11:51 AM) Alcohol Abuse Father. Arthritis Mother. Diabetes Mellitus Family Members In General.  Pregnancy / Birth History Marguarite Arbour, RMA; 07/08/2018 11:51 AM) Contraceptive History Contraceptive implant. Gravida 3 Irregular periods Length (months) of breastfeeding 12-24 Maternal age 80-25 Para 4  Other Problems Geni Bers [place], RMA; 07/08/2018 11:51 AM) Cholelithiasis Gastric Ulcer Hemorrhoids Kidney  Stone Pancreatitis     Review of Systems Geni Bers [place] RMA; 07/08/2018 11:51 AM) Respiratory Not Present- Bloody sputum, Chronic Cough, Difficulty Breathing, Snoring and Wheezing. Cardiovascular Not Present- Chest Pain, Difficulty Breathing Lying Down, Leg Cramps, Palpitations, Rapid Heart Rate, Shortness of Breath and Swelling of Extremities. Gastrointestinal Present- Constipation, Hemorrhoids and Indigestion. Not Present- Abdominal Pain, Bloating, Bloody Stool, Change in Bowel Habits, Chronic diarrhea, Difficulty Swallowing, Excessive gas, Gets full quickly at meals, Nausea, Rectal Pain and Vomiting. Female Genitourinary Present- Frequency and Nocturia. Not Present- Painful Urination, Pelvic Pain and Urgency. Neurological Not Present- Decreased Memory, Fainting, Headaches, Numbness, Seizures, Tingling, Tremor, Trouble walking and Weakness. Psychiatric Present- Change in Sleep Pattern and Depression. Not Present- Anxiety, Bipolar, Fearful and Frequent crying. Endocrine Not Present- Cold Intolerance, Excessive Hunger, Hair Changes, Heat Intolerance, Hot flashes and New Diabetes.  Vitals [employer] Haggett RMA; 07/08/2018 11:52 AM) 07/08/2018 11:52 AM Weight: 144.4 lb Height: 66in Body Surface Area: 1.74 m Body Mass Index: 23.31 kg/m  Temp.: 98.29F(Temporal)  Pulse: 109 (Regular)  P.OX: 97% (Room air) BP: 130/84 (Sitting, Left Arm, Standard)      Physical Exam Angela Ruff MD; 7/0/9628 12:30 PM)  General Mental Status-Alert. General Appearance-Not in acute distress. Build & Nutrition-Well nourished. Posture-Normal posture. Gait-Normal.  Head and Neck Head-normocephalic, atraumatic with no lesions or palpable masses. Trachea-midline.  Chest and Lung Exam Chest and lung exam reveals -on auscultation, normal breath sounds, no adventitious sounds and normal vocal resonance.  Cardiovascular Cardiovascular examination reveals -normal heart  sounds, regular rate and rhythm with no murmurs and no digital clubbing, cyanosis, edema, increased warmth or tenderness.  Abdomen Inspection Inspection of the abdomen reveals - No Hernias. Palpation/Percussion Palpation and Percussion of the abdomen reveal - Soft, Non Tender, No Rigidity (guarding), No hepatosplenomegaly and No Palpable abdominal masses.  Neurologic Neurologic evaluation reveals -alert and oriented x 3 with  no impairment of recent or remote memory, normal attention span and ability to concentrate, normal sensation and normal coordination.  Musculoskeletal Normal Exam - Bilateral-Upper Extremity Strength Normal and Lower Extremity Strength Normal. Note: Mobile mass posterior middle back consistent with lipoma. Approximately 10 cm.  5 mm nodule left posterior shoulder    Assessment & Plan Angela Ruff MD; 12/04/9933 12:04 PM)  LIPOMA OF BACK (D17.1) Impression: 43 year old female with a mobile fatty mass within her subcutaneous tissue of her mid lower back. I have recommended excisional biopsy as it is becoming painful for her to sit and she has noticed that his got bigger over the past 6 months. We have discussed this in detail including risk of infection and recurrence as well as postoperative pain and scarring.   SEBACEOUS CYST (L72.3) Impression: small, L back. Could be excised at the same time.  Risk include pain, bleeding, infection and recurrence.  All of these are minimal.

## 2018-07-08 NOTE — H&P (View-Only) (Signed)
History of Present Illness Leighton Ruff MD; 01/30/2750 12:29 PM) The patient is a 43 year old female who presents with a complaint of Mass. 44 year old female who presents to the office with complaints of a lower back subcutaneous cutaneous mass that has been present for several years. Over the past 6 months she feels like it has been enlarging and she reports pain especially with sitting for prolonged periods of time. She has never had any of these anywhere else before. She also reports an upper back mass that occasionally swells and becomes inflamed. She was told it was a sebaceous cyst. This is located on her posterior left shoulder.   Past Surgical History Geni Bers [place], RMA; 07/08/2018 11:51 AM) Gallbladder Surgery - Laparoscopic Shoulder Surgery Right.  Diagnostic Studies History Geni Bers [place], RMA; 07/08/2018 11:51 AM) Colonoscopy never  Allergies Geni Bers [place], RMA; 07/08/2018 11:51 AM) No Known Drug Allergies [07/08/2018]: Allergies Reconciled  Medication History [employer], RMA; 07/08/2018 11:52 AM) Adderall XR (30MG  Capsule ER 24HR, Oral) Active. Belviq (10MG  Tablet, Oral) Active. ALPRAZolam ER (1MG  Tablet ER 24HR, Oral) Active. Medications Reconciled  Social History Geni Bers [place], RMA; 07/08/2018 11:51 AM) Alcohol use Occasional alcohol use. Caffeine use Carbonated beverages. No drug use Tobacco use Never smoker.  Family History Geni Bers [place], RMA; 07/08/2018 11:51 AM) Alcohol Abuse Father. Arthritis Mother. Diabetes Mellitus Family Members In General.  Pregnancy / Birth History Marguarite Arbour, RMA; 07/08/2018 11:51 AM) Contraceptive History Contraceptive implant. Gravida 3 Irregular periods Length (months) of breastfeeding 12-24 Maternal age 37-25 Para 53  Other Problems Geni Bers [place], RMA; 07/08/2018 11:51 AM) Cholelithiasis Gastric Ulcer Hemorrhoids Kidney  Stone Pancreatitis     Review of Systems Geni Bers [place] RMA; 07/08/2018 11:51 AM) Respiratory Not Present- Bloody sputum, Chronic Cough, Difficulty Breathing, Snoring and Wheezing. Cardiovascular Not Present- Chest Pain, Difficulty Breathing Lying Down, Leg Cramps, Palpitations, Rapid Heart Rate, Shortness of Breath and Swelling of Extremities. Gastrointestinal Present- Constipation, Hemorrhoids and Indigestion. Not Present- Abdominal Pain, Bloating, Bloody Stool, Change in Bowel Habits, Chronic diarrhea, Difficulty Swallowing, Excessive gas, Gets full quickly at meals, Nausea, Rectal Pain and Vomiting. Female Genitourinary Present- Frequency and Nocturia. Not Present- Painful Urination, Pelvic Pain and Urgency. Neurological Not Present- Decreased Memory, Fainting, Headaches, Numbness, Seizures, Tingling, Tremor, Trouble walking and Weakness. Psychiatric Present- Change in Sleep Pattern and Depression. Not Present- Anxiety, Bipolar, Fearful and Frequent crying. Endocrine Not Present- Cold Intolerance, Excessive Hunger, Hair Changes, Heat Intolerance, Hot flashes and New Diabetes.  Vitals [employer] Haggett RMA; 07/08/2018 11:52 AM) 07/08/2018 11:52 AM Weight: 144.4 lb Height: 66in Body Surface Area: 1.74 m Body Mass Index: 23.31 kg/m  Temp.: 98.67F(Temporal)  Pulse: 109 (Regular)  P.OX: 97% (Room air) BP: 130/84 (Sitting, Left Arm, Standard)      Physical Exam Leighton Ruff MD; 7/0/0174 12:30 PM)  General Mental Status-Alert. General Appearance-Not in acute distress. Build & Nutrition-Well nourished. Posture-Normal posture. Gait-Normal.  Head and Neck Head-normocephalic, atraumatic with no lesions or palpable masses. Trachea-midline.  Chest and Lung Exam Chest and lung exam reveals -on auscultation, normal breath sounds, no adventitious sounds and normal vocal resonance.  Cardiovascular Cardiovascular examination reveals -normal heart  sounds, regular rate and rhythm with no murmurs and no digital clubbing, cyanosis, edema, increased warmth or tenderness.  Abdomen Inspection Inspection of the abdomen reveals - No Hernias. Palpation/Percussion Palpation and Percussion of the abdomen reveal - Soft, Non Tender, No Rigidity (guarding), No hepatosplenomegaly and No Palpable abdominal masses.  Neurologic Neurologic evaluation reveals -alert and oriented x 3 with  no impairment of recent or remote memory, normal attention span and ability to concentrate, normal sensation and normal coordination.  Musculoskeletal Normal Exam - Bilateral-Upper Extremity Strength Normal and Lower Extremity Strength Normal. Note: Mobile mass posterior middle back consistent with lipoma. Approximately 10 cm.  5 mm nodule left posterior shoulder    Assessment & Plan Leighton Ruff MD; 1/0/2725 12:04 PM)  LIPOMA OF BACK (D17.1) Impression: 43 year old female with a mobile fatty mass within her subcutaneous tissue of her mid lower back. I have recommended excisional biopsy as it is becoming painful for her to sit and she has noticed that his got bigger over the past 6 months. We have discussed this in detail including risk of infection and recurrence as well as postoperative pain and scarring.   SEBACEOUS CYST (L72.3) Impression: small, L back. Could be excised at the same time.  Risk include pain, bleeding, infection and recurrence.  All of these are minimal.

## 2018-07-22 ENCOUNTER — Encounter (HOSPITAL_BASED_OUTPATIENT_CLINIC_OR_DEPARTMENT_OTHER): Payer: Self-pay | Admitting: *Deleted

## 2018-07-22 ENCOUNTER — Other Ambulatory Visit: Payer: Self-pay

## 2018-07-22 NOTE — Progress Notes (Signed)
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

## 2018-07-24 ENCOUNTER — Ambulatory Visit (HOSPITAL_BASED_OUTPATIENT_CLINIC_OR_DEPARTMENT_OTHER)
Admission: RE | Admit: 2018-07-24 | Discharge: 2018-07-24 | Disposition: A | Discharge status: Home / Self Care | Payer: Self-pay | Attending: General Surgery | Admitting: General Surgery

## 2018-07-24 ENCOUNTER — Ambulatory Visit (HOSPITAL_BASED_OUTPATIENT_CLINIC_OR_DEPARTMENT_OTHER): Payer: Self-pay | Admitting: Anesthesiology

## 2018-07-24 ENCOUNTER — Other Ambulatory Visit: Payer: Self-pay

## 2018-07-24 ENCOUNTER — Encounter (HOSPITAL_BASED_OUTPATIENT_CLINIC_OR_DEPARTMENT_OTHER): Payer: Self-pay | Admitting: Emergency Medicine

## 2018-07-24 ENCOUNTER — Encounter (HOSPITAL_BASED_OUTPATIENT_CLINIC_OR_DEPARTMENT_OTHER)
Admission: RE | Disposition: A | Discharge status: Home / Self Care | Payer: Self-pay | Source: Home / Self Care | Attending: General Surgery

## 2018-07-24 DIAGNOSIS — D171 Benign lipomatous neoplasm of skin and subcutaneous tissue of trunk: Secondary | ICD-10-CM | POA: Insufficient documentation

## 2018-07-24 DIAGNOSIS — L720 Epidermal cyst: Secondary | ICD-10-CM | POA: Insufficient documentation

## 2018-07-24 DIAGNOSIS — D179 Benign lipomatous neoplasm, unspecified: Secondary | ICD-10-CM | POA: Diagnosis not present

## 2018-07-24 DIAGNOSIS — Z79899 Other long term (current) drug therapy: Secondary | ICD-10-CM | POA: Insufficient documentation

## 2018-07-24 DIAGNOSIS — F419 Anxiety disorder, unspecified: Secondary | ICD-10-CM | POA: Insufficient documentation

## 2018-07-24 DIAGNOSIS — L723 Sebaceous cyst: Secondary | ICD-10-CM | POA: Diagnosis not present

## 2018-07-24 DIAGNOSIS — F329 Major depressive disorder, single episode, unspecified: Secondary | ICD-10-CM | POA: Insufficient documentation

## 2018-07-24 HISTORY — DX: Personal history of urinary calculi: Z87.442

## 2018-07-24 HISTORY — DX: Fatty (change of) liver, not elsewhere classified: K76.0

## 2018-07-24 HISTORY — DX: Benign lipomatous neoplasm, unspecified: D17.9

## 2018-07-24 HISTORY — PX: EXCISION OF KELOID: SHX6267

## 2018-07-24 LAB — POCT PREGNANCY, URINE: Preg Test, Ur: NEGATIVE

## 2018-07-24 SURGERY — EXCISION, KELOID
Anesthesia: Monitor Anesthesia Care | Site: Back

## 2018-07-24 MED ORDER — MIDAZOLAM HCL 2 MG/2ML IJ SOLN
INTRAMUSCULAR | Status: AC
  Filled 2018-07-24: qty 4

## 2018-07-24 MED ORDER — CELECOXIB 200 MG PO CAPS
ORAL_CAPSULE | ORAL | Status: AC
  Filled 2018-07-24: qty 1

## 2018-07-24 MED ORDER — TRAMADOL HCL 50 MG PO TABS: 50.0000 mg | ORAL_TABLET | Freq: Four times a day (QID) | ORAL | 0 refills | Status: AC | PRN

## 2018-07-24 MED ORDER — LIDOCAINE HCL (CARDIAC) PF 100 MG/5ML IV SOSY
PREFILLED_SYRINGE | INTRAVENOUS | Status: DC | PRN
  Administered 2018-07-24: 60 mg via INTRAVENOUS

## 2018-07-24 MED ORDER — FENTANYL CITRATE (PF) 100 MCG/2ML IJ SOLN
INTRAMUSCULAR | Status: AC
  Filled 2018-07-24: qty 2

## 2018-07-24 MED ORDER — LACTATED RINGERS IV SOLN
INTRAVENOUS | Status: DC
  Administered 2018-07-24: 16:00:00 via INTRAVENOUS
  Filled 2018-07-24: qty 1000

## 2018-07-24 MED ORDER — MIDAZOLAM HCL 5 MG/5ML IJ SOLN
INTRAMUSCULAR | Status: DC | PRN
  Administered 2018-07-24: 1 mg via INTRAVENOUS
  Administered 2018-07-24: 2 mg via INTRAVENOUS
  Administered 2018-07-24: 1 mg via INTRAVENOUS

## 2018-07-24 MED ORDER — CELECOXIB 200 MG PO CAPS
200.0000 mg | ORAL_CAPSULE | ORAL | Status: AC
  Administered 2018-07-24: 200 mg via ORAL
  Filled 2018-07-24: qty 1

## 2018-07-24 MED ORDER — ACETAMINOPHEN 325 MG PO TABS
325.0000 mg | ORAL_TABLET | Freq: Once | ORAL | Status: DC
  Filled 2018-07-24: qty 2

## 2018-07-24 MED ORDER — ACETAMINOPHEN 650 MG RE SUPP
650.0000 mg | RECTAL | Status: DC | PRN
  Filled 2018-07-24: qty 1

## 2018-07-24 MED ORDER — FENTANYL CITRATE (PF) 100 MCG/2ML IJ SOLN
INTRAMUSCULAR | Status: DC | PRN
  Administered 2018-07-24 (×2): 12.5 ug via INTRAVENOUS
  Administered 2018-07-24: 25 ug via INTRAVENOUS
  Administered 2018-07-24 (×4): 12.5 ug via INTRAVENOUS

## 2018-07-24 MED ORDER — ONDANSETRON HCL 4 MG/2ML IJ SOLN
INTRAMUSCULAR | Status: AC
  Filled 2018-07-24: qty 2

## 2018-07-24 MED ORDER — ACETAMINOPHEN 500 MG PO TABS
ORAL_TABLET | ORAL | Status: AC
  Filled 2018-07-24: qty 2

## 2018-07-24 MED ORDER — ACETAMINOPHEN 10 MG/ML IV SOLN
1000.0000 mg | Freq: Once | INTRAVENOUS | Status: DC | PRN
  Filled 2018-07-24: qty 100

## 2018-07-24 MED ORDER — ACETAMINOPHEN 500 MG PO TABS
1000.0000 mg | ORAL_TABLET | ORAL | Status: AC
  Administered 2018-07-24: 1000 mg via ORAL
  Filled 2018-07-24: qty 2

## 2018-07-24 MED ORDER — PROPOFOL 500 MG/50ML IV EMUL
INTRAVENOUS | Status: DC | PRN
  Administered 2018-07-24: 50 ug/kg/min via INTRAVENOUS

## 2018-07-24 MED ORDER — ACETAMINOPHEN 160 MG/5ML PO SOLN
325.0000 mg | Freq: Once | ORAL | Status: DC
  Filled 2018-07-24: qty 20.3

## 2018-07-24 MED ORDER — BUPIVACAINE LIPOSOME 1.3 % IJ SUSP
20.0000 mL | Freq: Once | INTRAMUSCULAR | Status: DC
  Filled 2018-07-24: qty 20

## 2018-07-24 MED ORDER — GABAPENTIN 300 MG PO CAPS
ORAL_CAPSULE | ORAL | Status: AC
  Filled 2018-07-24: qty 1

## 2018-07-24 MED ORDER — CEFAZOLIN SODIUM-DEXTROSE 2-4 GM/100ML-% IV SOLN
2.0000 g | INTRAVENOUS | Status: AC
  Administered 2018-07-24: 2 g via INTRAVENOUS
  Filled 2018-07-24: qty 100

## 2018-07-24 MED ORDER — PROPOFOL 500 MG/50ML IV EMUL
INTRAVENOUS | Status: AC
  Filled 2018-07-24: qty 50

## 2018-07-24 MED ORDER — SODIUM CHLORIDE 0.9% FLUSH
3.0000 mL | Freq: Two times a day (BID) | INTRAVENOUS | Status: DC
  Filled 2018-07-24: qty 3

## 2018-07-24 MED ORDER — KETOROLAC TROMETHAMINE 30 MG/ML IJ SOLN
INTRAMUSCULAR | Status: DC | PRN
  Administered 2018-07-24: 30 mg via INTRAVENOUS

## 2018-07-24 MED ORDER — PROPOFOL 10 MG/ML IV BOLUS
INTRAVENOUS | Status: DC | PRN
  Administered 2018-07-24 (×2): 20 mg via INTRAVENOUS

## 2018-07-24 MED ORDER — LACTATED RINGERS IV SOLN
INTRAVENOUS | Status: DC
  Administered 2018-07-24: 14:00:00 via INTRAVENOUS
  Filled 2018-07-24: qty 1000

## 2018-07-24 MED ORDER — LIDOCAINE 2% (20 MG/ML) 5 ML SYRINGE
INTRAMUSCULAR | Status: AC
  Filled 2018-07-24: qty 5

## 2018-07-24 MED ORDER — OXYCODONE HCL 5 MG PO TABS
5.0000 mg | ORAL_TABLET | Freq: Once | ORAL | Status: DC | PRN
  Filled 2018-07-24: qty 1

## 2018-07-24 MED ORDER — FENTANYL CITRATE (PF) 100 MCG/2ML IJ SOLN
25.0000 ug | INTRAMUSCULAR | Status: DC | PRN
  Filled 2018-07-24: qty 1

## 2018-07-24 MED ORDER — SODIUM CHLORIDE 0.9% FLUSH
3.0000 mL | INTRAVENOUS | Status: DC | PRN
  Filled 2018-07-24: qty 3

## 2018-07-24 MED ORDER — GABAPENTIN 300 MG PO CAPS
300.0000 mg | ORAL_CAPSULE | ORAL | Status: AC
  Administered 2018-07-24: 300 mg via ORAL
  Filled 2018-07-24: qty 1

## 2018-07-24 MED ORDER — ACETAMINOPHEN 325 MG PO TABS
650.0000 mg | ORAL_TABLET | ORAL | Status: DC | PRN
  Filled 2018-07-24: qty 2

## 2018-07-24 MED ORDER — BUPIVACAINE-EPINEPHRINE 0.5% -1:200000 IJ SOLN
INTRAMUSCULAR | Status: DC | PRN
  Administered 2018-07-24: 10 mL
  Administered 2018-07-24: 20 mL

## 2018-07-24 MED ORDER — KETOROLAC TROMETHAMINE 30 MG/ML IJ SOLN
INTRAMUSCULAR | Status: AC
  Filled 2018-07-24: qty 1

## 2018-07-24 MED ORDER — MEPERIDINE HCL 25 MG/ML IJ SOLN
6.2500 mg | INTRAMUSCULAR | Status: DC | PRN
  Filled 2018-07-24: qty 1

## 2018-07-24 MED ORDER — OXYCODONE HCL 5 MG/5ML PO SOLN
5.0000 mg | Freq: Once | ORAL | Status: DC | PRN
  Filled 2018-07-24: qty 5

## 2018-07-24 MED ORDER — DEXAMETHASONE SODIUM PHOSPHATE 10 MG/ML IJ SOLN
INTRAMUSCULAR | Status: AC
  Filled 2018-07-24: qty 1

## 2018-07-24 MED ORDER — DEXAMETHASONE SODIUM PHOSPHATE 4 MG/ML IJ SOLN
INTRAMUSCULAR | Status: DC | PRN
  Administered 2018-07-24: 5 mg via INTRAVENOUS

## 2018-07-24 MED ORDER — CEFAZOLIN SODIUM-DEXTROSE 2-4 GM/100ML-% IV SOLN
INTRAVENOUS | Status: AC
  Filled 2018-07-24: qty 100

## 2018-07-24 MED ORDER — PROMETHAZINE HCL 25 MG/ML IJ SOLN
6.2500 mg | INTRAMUSCULAR | Status: DC | PRN
  Filled 2018-07-24: qty 1

## 2018-07-24 MED ORDER — SODIUM CHLORIDE 0.9 % IV SOLN
250.0000 mL | INTRAVENOUS | Status: DC | PRN
  Filled 2018-07-24: qty 250

## 2018-07-24 MED ORDER — ONDANSETRON HCL 4 MG/2ML IJ SOLN
INTRAMUSCULAR | Status: DC | PRN
  Administered 2018-07-24: 4 mg via INTRAVENOUS

## 2018-07-24 SURGICAL SUPPLY — 44 items
BENZOIN TINCTURE PRP APPL 2/3 (GAUZE/BANDAGES/DRESSINGS) IMPLANT
BLADE CLIPPER SURG (BLADE) IMPLANT
BLADE EXTENDED COATED 6.5IN (ELECTRODE) IMPLANT
BLADE SURG 10 STRL SS (BLADE) ×2 IMPLANT
CHLORAPREP W/TINT 26ML (MISCELLANEOUS) ×2 IMPLANT
COVER BACK TABLE 60X90IN (DRAPES) ×2 IMPLANT
COVER MAYO STAND STRL (DRAPES) ×2 IMPLANT
COVER WAND RF STERILE (DRAPES) ×2 IMPLANT
DECANTER SPIKE VIAL GLASS SM (MISCELLANEOUS) IMPLANT
DERMABOND ADVANCED (GAUZE/BANDAGES/DRESSINGS) ×2
DERMABOND ADVANCED .7 DNX12 (GAUZE/BANDAGES/DRESSINGS) ×2 IMPLANT
DRAPE LAPAROTOMY 100X72 PEDS (DRAPES) ×2 IMPLANT
DRAPE UTILITY XL STRL (DRAPES) ×4 IMPLANT
DRSG TEGADERM 4X4.75 (GAUZE/BANDAGES/DRESSINGS) IMPLANT
ELECT REM PT RETURN 9FT ADLT (ELECTROSURGICAL) ×2
ELECTRODE REM PT RTRN 9FT ADLT (ELECTROSURGICAL) ×1 IMPLANT
GAUZE SPONGE 4X4 12PLY STRL (GAUZE/BANDAGES/DRESSINGS) ×2 IMPLANT
GLOVE BIO SURGEON STRL SZ 6.5 (GLOVE) ×2 IMPLANT
GLOVE BIOGEL PI IND STRL 7.0 (GLOVE) ×1 IMPLANT
GLOVE BIOGEL PI INDICATOR 7.0 (GLOVE) ×1
GOWN STRL REUS W/TWL 2XL LVL3 (GOWN DISPOSABLE) ×2 IMPLANT
KIT TURNOVER CYSTO (KITS) ×2 IMPLANT
NEEDLE HYPO 22GX1.5 SAFETY (NEEDLE) ×2 IMPLANT
NS IRRIG 500ML POUR BTL (IV SOLUTION) ×2 IMPLANT
PAD ARMBOARD 7.5X6 YLW CONV (MISCELLANEOUS) ×2 IMPLANT
PENCIL BUTTON HOLSTER BLD 10FT (ELECTRODE) ×2 IMPLANT
STRIP CLOSURE SKIN 1/2X4 (GAUZE/BANDAGES/DRESSINGS) IMPLANT
SUT ETHILON 2 0 FS 18 (SUTURE) IMPLANT
SUT ETHILON 4 0 PS 2 18 (SUTURE) IMPLANT
SUT SILK 2 0 SH (SUTURE) IMPLANT
SUT VIC AB 2-0 SH 27 (SUTURE) ×2
SUT VIC AB 2-0 SH 27XBRD (SUTURE) ×2 IMPLANT
SUT VIC AB 3-0 SH 18 (SUTURE) IMPLANT
SUT VIC AB 4-0 SH 18 (SUTURE) IMPLANT
SUT VICRYL 4-0 PS2 18IN ABS (SUTURE) ×4 IMPLANT
SYR BULB IRRIGATION 50ML (SYRINGE) ×2 IMPLANT
SYR CONTROL 10ML LL (SYRINGE) ×2 IMPLANT
TOWEL OR 17X24 6PK STRL BLUE (TOWEL DISPOSABLE) ×4 IMPLANT
TRAY DSU PREP LF (CUSTOM PROCEDURE TRAY) IMPLANT
TUBE ANAEROBIC SPECIMEN COL (MISCELLANEOUS) IMPLANT
TUBE CONNECTING 12X1/4 (SUCTIONS) ×2 IMPLANT
UNDERPAD 30X30 (UNDERPADS AND DIAPERS) ×2 IMPLANT
WATER STERILE IRR 500ML POUR (IV SOLUTION) ×2 IMPLANT
YANKAUER SUCT BULB TIP NO VENT (SUCTIONS) ×4 IMPLANT

## 2018-07-24 NOTE — Discharge Instructions (Addendum)
GENERAL SURGERY: POST OP INSTRUCTIONS ° °1. DIET: Follow a light bland diet the first 24 hours after arrival home, such as soup, liquids, crackers, etc.  Be sure to include lots of fluids daily.  Avoid fast food or heavy meals as your are more likely to get nauseated.   °2. Take your usually prescribed home medications unless otherwise directed. °3. PAIN CONTROL: °a. Pain is best controlled by a usual combination of three different methods TOGETHER: °i. Ice/Heat °ii. Over the counter pain medication °iii. Prescription pain medication °b. Most patients will experience some swelling and bruising around the incisions.  Ice packs or heating pads (30-60 minutes up to 6 times a day) will help. Use ice for the first few days to help decrease swelling and bruising, then switch to heat to help relax tight/sore spots and speed recovery.  Some people prefer to use ice alone, heat alone, alternating between ice & heat.  Experiment to what works for you.  Swelling and bruising can take several weeks to resolve.   °c. It is helpful to take an over-the-counter pain medication regularly for the first few weeks.  Choose one of the following that works best for you: °i. Naproxen (Aleve, etc)  Two 220mg tabs twice a day °ii. Ibuprofen (Advil, etc) Three 200mg tabs four times a day (every meal & bedtime) °d. A  prescription for pain medication (such as Percocet, oxycodone, hydrocodone, etc) should be given to you upon discharge.  Take your pain medication as prescribed.  °i. If you are having problems/concerns with the prescription medicine (does not control pain, nausea, vomiting, rash, itching, etc), please call us [phone] to see if we need to switch you to a different pain medicine that will work better for you and/or control your side effect better. °ii. If you need a refill on your pain medication, please contact your pharmacy.  They will contact our office to request authorization. Prescriptions will not be filled after 5  pm or on week-ends. °4. Avoid getting constipated.  Between the surgery and the pain medications, it is common to experience some constipation.  Increasing fluid intake and taking a fiber supplement (such as Metamucil, Citrucel, FiberCon, MiraLax, etc) 1-2 times a day regularly will usually help prevent this problem from occurring.  A mild laxative (prune juice, Milk of Magnesia, MiraLax, etc) should be taken according to package directions if there are no bowel movements after 48 hours.   °5. Wash / shower every day.  You may shower over the dressings as they are waterproof.  Continue to shower over incision(s) after the dressing is off. °6. Remove your waterproof bandages 5 days after surgery.  You may leave the incision open to air.  You may have skin tapes (Steri Strips) covering the incision(s).  Leave them on until one week, then remove.  You may replace a dressing/Band-Aid to cover the incision for comfort if you wish.  ° ° ° ° °7. ACTIVITIES as tolerated:   °a. You may resume regular (light) daily activities beginning the next day--such as daily self-care, walking, climbing stairs--gradually increasing activities as tolerated.  If you can walk 30 minutes without difficulty, it is safe to try more intense activity such as jogging, treadmill, bicycling, low-impact aerobics, swimming, etc. °b. Save the most intensive and strenuous activity for last such as sit-ups, heavy lifting, contact sports, etc  Refrain from any heavy lifting or straining until you are off narcotics for pain control.   °c. DO NOT PUSH   THROUGH PAIN.  Let pain be your guide: If it hurts to do something, don't do it.  Pain is your body warning you to avoid that activity for another week until the pain goes down. d. You may drive when you are no longer taking prescription pain medication, you can comfortably wear a seatbelt, and you can safely maneuver your car and apply brakes. e. Dennis Bast may have sexual intercourse when it is comfortable.   8. FOLLOW UP in our office a. Please call [facility] at [phone] to set up an appointment to see your surgeon in the office for a follow-up appointment approximately 2-3 weeks after your surgery. b. Make sure that you call for this appointment the day you arrive home to insure a convenient appointment time. 9. IF YOU HAVE DISABILITY OR FAMILY LEAVE FORMS, BRING THEM TO THE OFFICE FOR PROCESSING.  DO NOT GIVE THEM TO YOUR DOCTOR.   WHEN TO CALL us [phone]: 1. Poor pain control 2. Reactions / problems with new medications (rash/itching, nausea, etc)  3. Fever over 101.5 F (38.5 C) 4. Worsening swelling or bruising 5. Continued bleeding from incision. 6. Increased pain, redness, or drainage from the incision   The clinic staff is available to answer your questions during regular business hours (8:30am-5pm).  Please dont hesitate to call and ask to speak to one of our nurses for clinical concerns.   If you have a medical emergency, go to the nearest emergency room or call 911.  A surgeon from [facility] is always on call at the [facility], [place], [place], [place], [place]  [address] ? MAIN: [phone] ? TOLL FREE: [phone] ?  FAX [phone] [email or web address]    Post Anesthesia Home Care Instructions  Activity: Get plenty of rest for the remainder of the day. A responsible adult should stay with you for 24 hours following the procedure.  For the next 24 hours, DO NOT: -Drive a car -Paediatric nurse -Drink alcoholic beverages -Take any medication unless instructed by your physician -Make any legal decisions or sign important papers.  Meals: Start with liquid foods such as gelatin or soup. Progress to regular foods as tolerated. Avoid greasy, spicy, heavy foods. If nausea and/or vomiting occur, drink only clear liquids until the nausea and/or vomiting subsides. Call your physician if  vomiting continues.  Special Instructions/Symptoms: Your throat may feel dry or sore from the anesthesia or the breathing tube placed in your throat during surgery. If this causes discomfort, gargle with warm salt water. The discomfort should disappear within 24 hours.  If you had a scopolamine patch placed behind your ear for the management of post- operative nausea and/or vomiting:  1. The medication in the patch is effective for 72 hours, after which it should be removed.  Wrap patch in a tissue and discard in the trash. Wash hands thoroughly with soap and water. 2. You may remove the patch earlier than 72 hours if you experience unpleasant side effects which may include dry mouth, dizziness or visual disturbances. 3. Avoid touching the patch. Wash your hands with soap and water after contact with the patch.    No advil, aleve, motrin, ibuprofen until 9 pm tonight

## 2018-07-24 NOTE — Op Note (Signed)
07/24/2018  2:57 PM  PATIENT:  Angela Hess  43 y.o. female  Patient Care Team: Patient, No Pcp Per as PCP - General (General Practice)  PRE-OPERATIVE DIAGNOSIS:  LIPOMA, SEBACEOUS CYST  POST-OPERATIVE DIAGNOSIS:  LIPOMA, SEBACEOUS CYST  PROCEDURE:   EXCISIONAL BIOPSY LOWER BACK LIPOMA AND UPPER BACK CYST    Surgeon(s): Leighton Ruff, MD  ASSISTANT: none   ANESTHESIA:   local and MAC  EBL: 56m  Total I/O In: 200 [I.V.:200] Out: 0   DRAINS: none   SPECIMEN:  Source of Specimen:  lipoma, sebaceous cyst  DISPOSITION OF SPECIMEN:  PATHOLOGY  COUNTS:  YES  PLAN OF CARE: Discharge to home after PACU  PATIENT DISPOSITION:  PACU - hemodynamically stable.  INDICATION: 43yo F with a lower back mass and recurrent upper back sebaceous cyst   OR FINDINGS: sebaceous cyst (~523m.  Lipoma (~8x4cm)  DESCRIPTION: the patient was identified in the preoperative holding area and taken to the OR where they were laid prone on the operating room table.  MAC anesthesia was induced without difficulty. SCDs were also noted to be in place prior to the initiation of anesthesia.  The patient was then prepped and draped in the usual sterile fashion.   A surgical timeout was performed indicating the correct patient, procedure, positioning and need for preoperative antibiotics.   I began by placing a field block around the lower back lipoma using half percent Marcaine with epinephrine.  An incision was made over the lipoma and dissection was carried down to its level.  The lipoma was gently lifted out of the wound using blunt dissection.  The entire capsule was removed.  Hemostasis was achieved using electrocautery.  The subcutaneous layer was reapproximated using a running 2-0 Vicryl suture.  The skin was closed using a running 4-0 Vicryl suture and Dermabond.  I then turned my attention to the smaller upper back mass.  A small incision was made over this lesion after a field block was placed.   I used sharp dissection to remove the subcutaneous lesion.  A 2-0 Vicryl suture was used to reapproximate the dermal layer and the skin was closed with 4-0 Vicryl subcuticular suture and Dermabond.  The patient tolerated this well was awakened from anesthesia and sent to the postanesthesia care unit in stable condition.  All counts were correct per operating room staff.

## 2018-07-24 NOTE — Transfer of Care (Signed)
Immediate Anesthesia Transfer of Care Note  Patient: Angela Hess  Procedure(s) Performed: Procedure(s) (LRB): EXCISIONAL BIOPSY LOWER BACK LIPOMA AND UPPER BACK CYST (N/A)  Patient Location: PACU  Anesthesia Type: General  Level of Consciousness: awake, sedated, patient cooperative and responds to stimulation  Airway & Oxygen Therapy: Patient Spontanous on RA   Post-op Assessment: Report given to PACU RN, Post -op Vital signs reviewed and stable and Patient moving all extremities  Post vital signs: Reviewed and stable  Complications: No apparent anesthesia complications

## 2018-07-24 NOTE — Interval H&P Note (Signed)
History and Physical Interval Note:  07/24/2018 2:01 PM  Angela Hess  has presented today for surgery, with the diagnosis of LIPOMA, SEBACEOUS CYST  The various methods of treatment have been discussed with the patient and family. After consideration of risks, benefits and other options for treatment, the patient has consented to  Procedure(s): EXCISIONAL BIOPSY LOWER BACK LIPOMA AND UPPER BACK CYST (N/A) as a surgical intervention .  The patient's history has been reviewed, patient examined, no change in status, stable for surgery.  I have reviewed the patient's chart and labs.  Questions were answered to the patient's satisfaction.     Rosario Adie, MD  [facility]

## 2018-07-24 NOTE — Anesthesia Procedure Notes (Signed)
Procedure Name: MAC Date/Time: 07/24/2018 2:28 PM Performed by: Justice Rocher, CRNA Pre-anesthesia Checklist: Patient identified, Emergency Drugs available, Suction available, Patient being monitored and Timeout performed Patient Re-evaluated:Patient Re-evaluated prior to induction Oxygen Delivery Method: Simple face mask Preoxygenation: Pre-oxygenation with 100% oxygen Induction Type: IV induction Placement Confirmation: positive ETCO2 and breath sounds checked- equal and bilateral

## 2018-07-24 NOTE — Anesthesia Preprocedure Evaluation (Addendum)
Anesthesia Evaluation  Patient identified by MRN, date of birth, ID band Patient awake    Reviewed: Allergy & Precautions, NPO status , Patient's Chart, lab work & pertinent test results  Airway Mallampati: II  TM Distance: >3 FB Neck ROM: Full    Dental  (+) Teeth Intact, Dental Advisory Given   Pulmonary    breath sounds clear to auscultation       Cardiovascular negative cardio ROS   Rhythm:Regular Rate:Normal     Neuro/Psych Anxiety Depression    GI/Hepatic Neg liver ROS, GERD  ,  Endo/Other  negative endocrine ROS  Renal/GU negative Renal ROS     Musculoskeletal  (+) Arthritis ,   Abdominal Normal abdominal exam  (+)   Peds  Hematology negative hematology ROS (+)   Anesthesia Other Findings   Reproductive/Obstetrics                            Anesthesia Physical Anesthesia Plan  ASA: II  Anesthesia Plan: MAC   Post-op Pain Management:    Induction: Intravenous  PONV Risk Score and Plan: 3 and Propofol infusion, Ondansetron and Midazolam  Airway Management Planned: Natural Airway and Simple Face Mask  Additional Equipment:   Intra-op Plan:   Post-operative Plan:   Informed Consent: I have reviewed the patients History and Physical, chart, labs and discussed the procedure including the risks, benefits and alternatives for the proposed anesthesia with the patient or authorized representative who has indicated his/her understanding and acceptance.       Plan Discussed with: CRNA  Anesthesia Plan Comments:        Anesthesia Quick Evaluation

## 2018-07-25 ENCOUNTER — Encounter (HOSPITAL_BASED_OUTPATIENT_CLINIC_OR_DEPARTMENT_OTHER): Payer: Self-pay | Admitting: General Surgery

## 2018-08-05 NOTE — Anesthesia Postprocedure Evaluation (Signed)
Anesthesia Post Note  Patient: Angela Hess  Procedure(s) Performed: EXCISIONAL BIOPSY LOWER BACK LIPOMA AND UPPER BACK CYST (N/A Back)     Patient location during evaluation: PACU Anesthesia Type: MAC Level of consciousness: awake and alert Pain management: pain level controlled Vital Signs Assessment: post-procedure vital signs reviewed and stable Respiratory status: spontaneous breathing, nonlabored ventilation, respiratory function stable and patient connected to nasal cannula oxygen Cardiovascular status: stable and blood pressure returned to baseline Postop Assessment: no apparent nausea or vomiting Anesthetic complications: no    Last Vitals:  Vitals:   07/24/18 1545 07/24/18 1605  BP: 116/76 129/78  Pulse: (!) 49 72  Resp: 11 14  Temp:  36.6 C  SpO2: 100% 100%    Last Pain:  Vitals:   07/24/18 1605  TempSrc:   PainSc: 0-No pain                 Effie Berkshire

## 2018-10-20 DIAGNOSIS — F4010 Social phobia, unspecified: Secondary | ICD-10-CM | POA: Diagnosis not present

## 2018-10-20 DIAGNOSIS — F900 Attention-deficit hyperactivity disorder, predominantly inattentive type: Secondary | ICD-10-CM | POA: Diagnosis not present

## 2019-02-16 DIAGNOSIS — H10021 Other mucopurulent conjunctivitis, right eye: Secondary | ICD-10-CM | POA: Diagnosis not present

## 2019-05-07 DIAGNOSIS — Z6825 Body mass index (BMI) 25.0-25.9, adult: Secondary | ICD-10-CM | POA: Diagnosis not present

## 2019-05-07 DIAGNOSIS — Z01419 Encounter for gynecological examination (general) (routine) without abnormal findings: Secondary | ICD-10-CM | POA: Diagnosis not present

## 2019-06-04 DIAGNOSIS — F900 Attention-deficit hyperactivity disorder, predominantly inattentive type: Secondary | ICD-10-CM | POA: Diagnosis not present

## 2019-06-11 ENCOUNTER — Ambulatory Visit (INDEPENDENT_AMBULATORY_CARE_PROVIDER_SITE_OTHER): Payer: BLUE CROSS/BLUE SHIELD | Admitting: Sports Medicine

## 2019-06-11 ENCOUNTER — Encounter: Payer: Self-pay | Admitting: Sports Medicine

## 2019-06-11 ENCOUNTER — Other Ambulatory Visit: Payer: Self-pay

## 2019-06-11 VITALS — BP 100/74 | Ht 66.0 in | Wt 152.0 lb

## 2019-06-11 DIAGNOSIS — G8929 Other chronic pain: Secondary | ICD-10-CM

## 2019-06-11 DIAGNOSIS — M5412 Radiculopathy, cervical region: Secondary | ICD-10-CM | POA: Diagnosis not present

## 2019-06-11 DIAGNOSIS — M25511 Pain in right shoulder: Secondary | ICD-10-CM | POA: Diagnosis not present

## 2019-06-11 MED ORDER — GABAPENTIN 100 MG PO CAPS: ORAL_CAPSULE | ORAL | 0 refills | Status: AC

## 2019-06-11 NOTE — Patient Instructions (Signed)
It was great to meet you today.  We were able to rule out a lot of different possible causes of your shoulder and forearm pain.  Seems most likely this is all related to a pinched nerve in your neck.  For now, the best treatment will be physical therapy and medication.  We will get a neck x-ray to help [place] further assess your neck and where exactly the pinched nerve might be.  Gabapentin is a medication that we are giving you to help with nerve related pain.  You can start taking 1 pill (100 mg) tonight.  Can assess for grogginess in the morning.  Sometimes people complain about a "hangover" effect from this medication.  If you feel okay tomorrow morning, increase your dose to 2 pills tomorrow night and 3 pills the following night.  Once you reach 3 pills (300 mg) you can continue taking that dose nightly.  You do not have to continue using your wrist brace.  We will follow-up in 4 weeks to make sure that you are generally improving and do not need to do any further work-up.

## 2019-06-11 NOTE — Assessment & Plan Note (Signed)
Ongoing shoulder and arm pain for the past 7 months.  The differential at this time includes cervical nerve impingement, rotator cuff injury, shoulder nerve impingement, ulnar or carpal tunnel neuropathy.  Most likely due to cervical nerve impingement.  Very low suspicion for a rotator cuff injury or shoulder nerve impingement based on largely unremarkable physical exam.  No evidence on physical exam would support an ulnar nerve for carpal tunnel related pathology.  Plan to begin treatment with physical therapy for cervical nerve impingement in addition to gabapentin for symptomatic relief.  We will follow-up in 4 weeks.  If no improvement in 4 weeks, will consider additional imaging with MRI.

## 2019-06-11 NOTE — Progress Notes (Signed)
Angela Hess is a 43 y.o. female who presents to [facility] today for the following: right shoulder and arm pain.  Right shoulder and arm pain [name] is presenting to clinic today with 7 months of worsening right shoulder and arm pain.  She reports that she has been experiencing a mild achy pain behind her right shoulder/neck for the past 7 months.  It is generally been tolerable but has grown increasingly worse in the past 2 months.  Recently, she has noticed significant forearm discomfort while using her mouse and occasional numbness down her right arm.  She has occasional numbness and tingling covering her arm and forearm.  This is occasionally accompanied by weakness of her fingers.  Is difficult for her to specify which fingers might be affected.  In the past 2 weeks, she has bought an ergonomic mouse, a standing desk and has begun wearing a wrist splint at night.  She thinks that the ergonomic mouse has been helpful but has not noticed any significant difference with wearing the wrist splint.   PMH reviewed.  Multiple right shoulder dislocations in childhood.  Right shoulder arthroscopy 29 years ago. ROS as above. Medications reviewed.  Exam:  BP 100/74   Ht 5\' 6"  (1.676 m)   Wt 152 lb (68.9 kg)   BMI 24.53 kg/m  Gen: Well NAD Neck Inspection: No skin changes, no obvious bony abnormalities Palpation: Mild tenderness to palpation of cervical paraspinal region ROM: Normal flexion, extension and rotation Special tests: No reproducible pain with axial pressure applied to the neck extension and rotation (Spurlings)  Shoulder: Inspection reveals no obvious deformity, atrophy, or asymmetry. No bruising. No swelling Palpation is normal with no TTP over [facility] joint or bicipital groove. ROM: full ROM in flexion, abduction, internal/external rotation Strength: normal strength Vascularly intact distally,  Neuro: trace reflexes of bicep, tricep and brachioradialis bilaterally.  Special Tests:   - Impingement: Neg Hawkins, empty can sign. - Supraspinatous: Negative empty can.  5/5 strength with resisted flexion at 20 degrees  Elbow  Inspection: No evident skin changes, rashes, evidence of infection. Palpation: Medial and lateral epicondyles nontender to palpation.  Wrist flexor and extensor muscle bellies nontender to palpation. ROM: Normal range of motion Strength: Mildly diminished strength with elbow flexion Neurovascular: Minimal bicep and tricep reflex bilaterally  Wrist Inspection: No skin changes or obvious bony abnormalities Palpation: Nontender to palpation of the wrist or thenar and hyperthenar eminences. ROM: Normal wrist flexion and extension Strength: Mildly diminished strength with wrist flexion Neurovascular: Minimal brachioradialis reflex Special tests: Negative Tinel and Phalen's test  No results found.   Assessment and Plan: 1) Right shoulder pain Ongoing shoulder and arm pain for the past 7 months.  The differential at this time includes cervical nerve impingement, rotator cuff injury, shoulder nerve impingement, ulnar or carpal tunnel neuropathy.  Most likely due to cervical nerve impingement.  Very low suspicion for a rotator cuff injury or shoulder nerve impingement based on largely unremarkable physical exam.  No evidence on physical exam would support an ulnar nerve for carpal tunnel related pathology.  Plan to begin treatment with physical therapy for cervical nerve impingement in addition to gabapentin for symptomatic relief.  We will follow-up in 4 weeks.  If no improvement in 4 weeks, will consider additional imaging with MRI.   Patient seen and evaluated with the resident.  I agree with the above plan of care.  Patient's history is consistent with probable C6 radiculopathy from the cervical spine.  Treatment as above.  We will avoid NSAIDs given her GI upset with NSAIDs.  Follow-up with me in 4 weeks for reevaluation.  We will also prescribe some  gabapentin to take at night.  If symptoms persist or worsen consider further diagnostic imaging depending on what cervical spine x-rays show.

## 2019-06-22 ENCOUNTER — Ambulatory Visit
Admission: RE | Admit: 2019-06-22 | Discharge: 2019-06-22 | Disposition: A | Discharge status: Home / Self Care | Payer: BLUE CROSS/BLUE SHIELD | Source: Ambulatory Visit | Attending: Sports Medicine | Admitting: Sports Medicine

## 2019-06-22 DIAGNOSIS — M542 Cervicalgia: Secondary | ICD-10-CM | POA: Diagnosis not present

## 2019-06-22 DIAGNOSIS — M5412 Radiculopathy, cervical region: Secondary | ICD-10-CM

## 2019-07-09 ENCOUNTER — Telehealth: Payer: BLUE CROSS/BLUE SHIELD | Admitting: Sports Medicine

## 2021-10-28 IMAGING — CR DG CERVICAL SPINE 2 OR 3 VIEWS
3 series · 3 of 3 positions shown · non-contrast
Comparison: Radiographs dated 06/06/2013

CLINICAL DATA: Neck and right arm pain.  No known injury.

EXAM:
CERVICAL SPINE - 2-3 VIEW

[w c-spine lat]
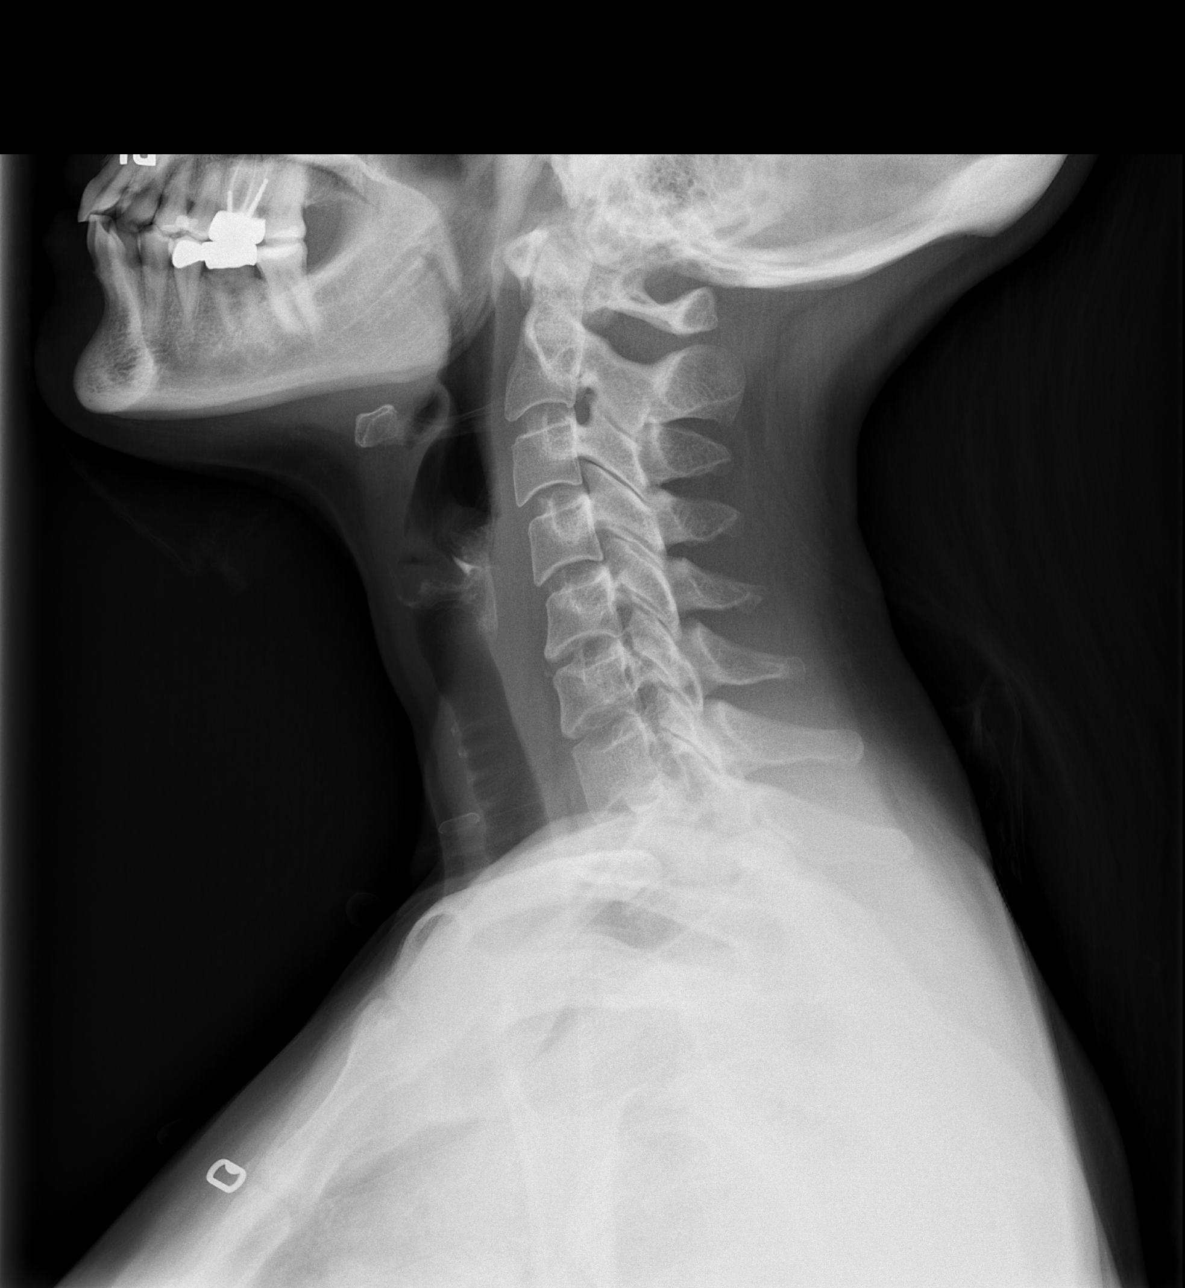

[w c-spine a.p. *]
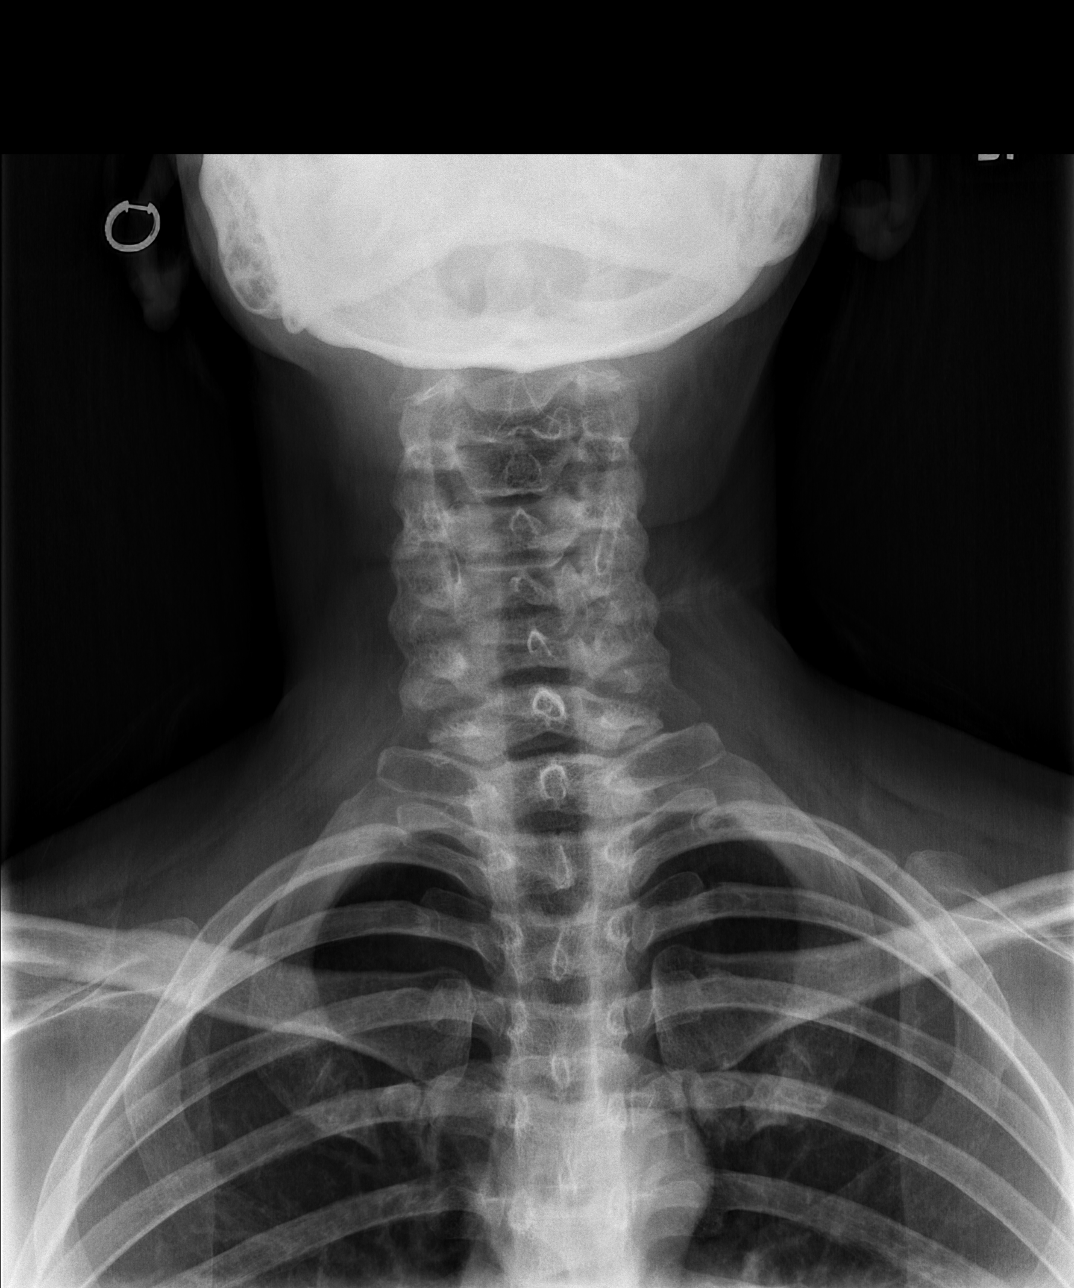

[w c-spine odontoid *]
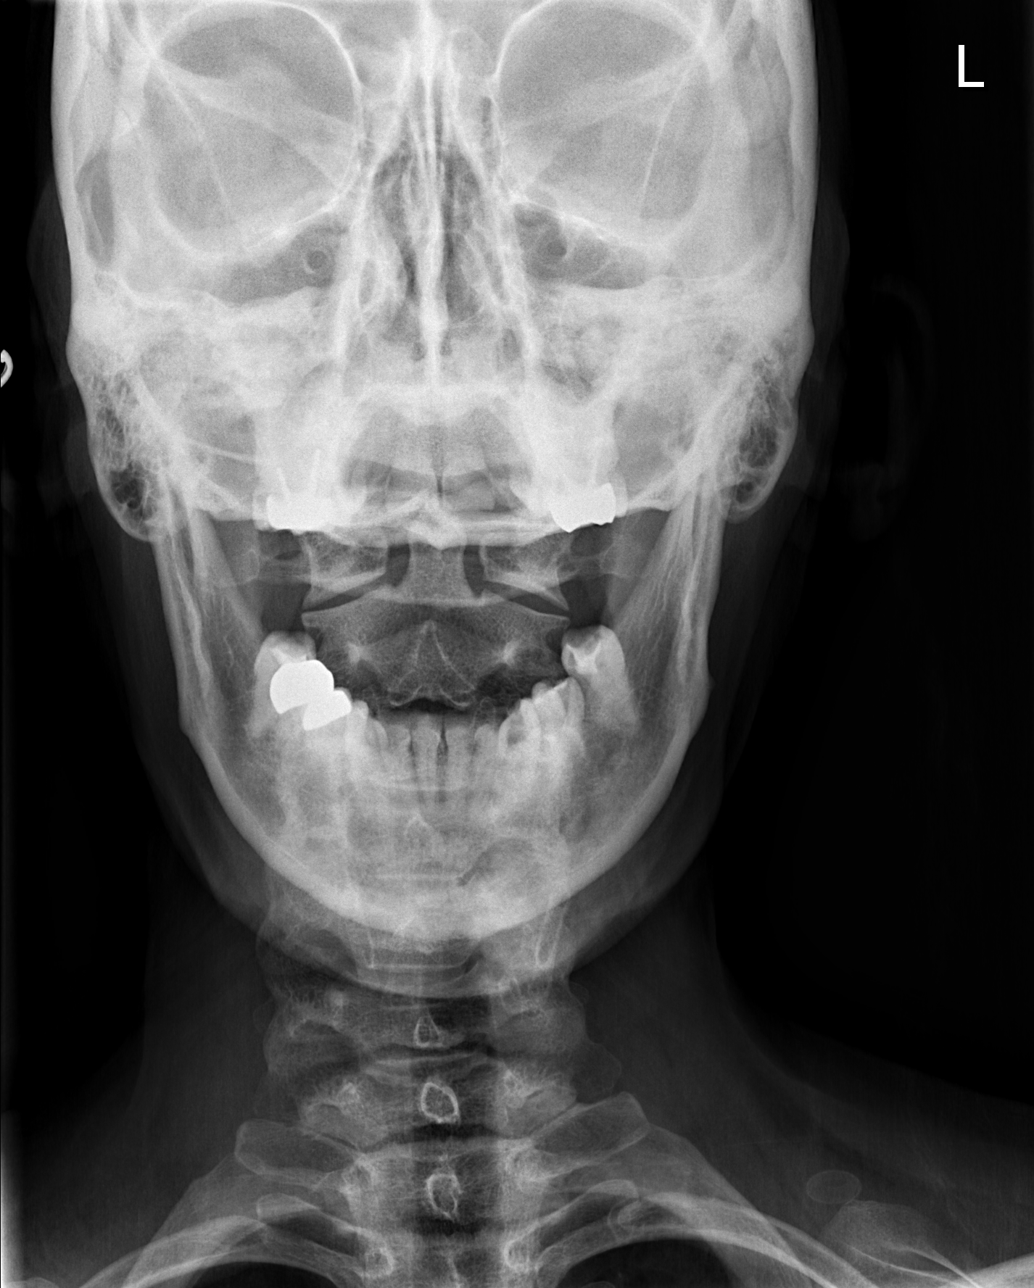

[3 of 3 positions shown; findings below may reference images not displayed]

FINDINGS: There is no evidence of cervical spine fracture or prevertebral soft
tissue swelling. Alignment is normal. No other significant bone
abnormalities are identified.
IMPRESSION: Negative cervical spine radiographs.
